# Patient Record
Sex: Male | Born: 1965 | Race: White | Hispanic: No | Marital: Single | Smoking: Never smoker
Health system: Southern US, Community
[De-identification: ages and names within clinical notes are randomized; demographics above are authoritative.]

## PROBLEM LIST (undated history)

## (undated) DIAGNOSIS — E119 Type 2 diabetes mellitus without complications: Secondary | ICD-10-CM

## (undated) DIAGNOSIS — K219 Gastro-esophageal reflux disease without esophagitis: Secondary | ICD-10-CM

## (undated) DIAGNOSIS — Z6831 Body mass index (BMI) 31.0-31.9, adult: Secondary | ICD-10-CM

## (undated) DIAGNOSIS — H6690 Otitis media, unspecified, unspecified ear: Secondary | ICD-10-CM

## (undated) DIAGNOSIS — E7800 Pure hypercholesterolemia, unspecified: Secondary | ICD-10-CM

## (undated) DIAGNOSIS — J189 Pneumonia, unspecified organism: Secondary | ICD-10-CM

## (undated) DIAGNOSIS — G4730 Sleep apnea, unspecified: Secondary | ICD-10-CM

## (undated) HISTORY — DX: Sleep apnea, unspecified: G47.30

## (undated) HISTORY — DX: Body mass index (BMI) 31.0-31.9, adult: Z68.31

## (undated) HISTORY — PX: COLONOSCOPY: SHX174

## (undated) HISTORY — PX: CHOLECYSTECTOMY: SHX55

## (undated) HISTORY — DX: Type 2 diabetes mellitus without complications: E11.9

## (undated) HISTORY — DX: Gastro-esophageal reflux disease without esophagitis: K21.9

## (undated) HISTORY — DX: Pure hypercholesterolemia, unspecified: E78.00

---

## 2013-10-15 ENCOUNTER — Emergency Department (HOSPITAL_COMMUNITY)
Admission: EM | Admit: 2013-10-15 | Discharge: 2013-10-16 | Disposition: A | Discharge status: Home / Self Care | Payer: BC Managed Care – PPO | Attending: Emergency Medicine | Admitting: Emergency Medicine

## 2013-10-15 ENCOUNTER — Emergency Department (HOSPITAL_COMMUNITY): Payer: BC Managed Care – PPO

## 2013-10-15 ENCOUNTER — Encounter (HOSPITAL_COMMUNITY): Payer: Self-pay | Admitting: Emergency Medicine

## 2013-10-15 DIAGNOSIS — H6690 Otitis media, unspecified, unspecified ear: Secondary | ICD-10-CM | POA: Insufficient documentation

## 2013-10-15 DIAGNOSIS — Z8701 Personal history of pneumonia (recurrent): Secondary | ICD-10-CM | POA: Insufficient documentation

## 2013-10-15 DIAGNOSIS — J40 Bronchitis, not specified as acute or chronic: Secondary | ICD-10-CM

## 2013-10-15 DIAGNOSIS — R6889 Other general symptoms and signs: Secondary | ICD-10-CM

## 2013-10-15 DIAGNOSIS — Z880 Allergy status to penicillin: Secondary | ICD-10-CM | POA: Insufficient documentation

## 2013-10-15 DIAGNOSIS — R739 Hyperglycemia, unspecified: Secondary | ICD-10-CM

## 2013-10-15 DIAGNOSIS — R7309 Other abnormal glucose: Secondary | ICD-10-CM | POA: Insufficient documentation

## 2013-10-15 DIAGNOSIS — Z79899 Other long term (current) drug therapy: Secondary | ICD-10-CM | POA: Insufficient documentation

## 2013-10-15 HISTORY — DX: Pneumonia, unspecified organism: J18.9

## 2013-10-15 HISTORY — DX: Otitis media, unspecified, unspecified ear: H66.90

## 2013-10-15 LAB — COMPREHENSIVE METABOLIC PANEL
ALK PHOS: 68 U/L (ref 39–117)
ALT: 61 U/L — ABNORMAL HIGH (ref 0–53)
AST: 25 U/L (ref 0–37)
Albumin: 4.4 g/dL (ref 3.5–5.2)
BILIRUBIN TOTAL: 0.2 mg/dL — AB (ref 0.3–1.2)
BUN: 17 mg/dL (ref 6–23)
CHLORIDE: 98 meq/L (ref 96–112)
CO2: 18 meq/L — AB (ref 19–32)
Calcium: 10.5 mg/dL (ref 8.4–10.5)
Creatinine, Ser: 0.68 mg/dL (ref 0.50–1.35)
GFR calc Af Amer: >90 mL/min (ref >90)
Glucose, Bld: 251 mg/dL — ABNORMAL HIGH (ref 70–99)
Potassium: 4.4 mEq/L (ref 3.7–5.3)
Sodium: 137 mEq/L (ref 137–147)
Total Protein: 8.1 g/dL (ref 6.0–8.3)

## 2013-10-15 LAB — URINE MICROSCOPIC-ADD ON

## 2013-10-15 LAB — URINALYSIS, ROUTINE W REFLEX MICROSCOPIC
Bilirubin Urine: NEGATIVE
Glucose, UA: >1000 mg/dL — AB
HGB URINE DIPSTICK: NEGATIVE
Ketones, ur: 15 mg/dL — AB
Leukocytes, UA: NEGATIVE
Nitrite: NEGATIVE
PROTEIN: NEGATIVE mg/dL
Specific Gravity, Urine: 1.036 — ABNORMAL HIGH (ref 1.005–1.030)
UROBILINOGEN UA: 0.2 mg/dL (ref 0.0–1.0)
pH: 6 (ref 5.0–8.0)

## 2013-10-15 LAB — CBC WITH DIFFERENTIAL/PLATELET
Basophils Absolute: 0 10*3/uL (ref 0.0–0.1)
Basophils Relative: 0 % (ref 0–1)
Eosinophils Absolute: 0 10*3/uL (ref 0.0–0.7)
Eosinophils Relative: 0 % (ref 0–5)
HCT: 43.5 % (ref 39.0–52.0)
Hemoglobin: 15.6 g/dL (ref 13.0–17.0)
Lymphocytes Relative: 15 % (ref 12–46)
Lymphs Abs: 1.7 10*3/uL (ref 0.7–4.0)
MCH: 31.1 pg (ref 26.0–34.0)
MCHC: 35.9 g/dL (ref 30.0–36.0)
MCV: 86.7 fL (ref 78.0–100.0)
Monocytes Absolute: 0.7 10*3/uL (ref 0.1–1.0)
Monocytes Relative: 6 % (ref 3–12)
Neutro Abs: 9.2 10*3/uL — ABNORMAL HIGH (ref 1.7–7.7)
Neutrophils Relative %: 79 % — ABNORMAL HIGH (ref 43–77)
Platelets: 362 10*3/uL (ref 150–400)
RBC: 5.02 MIL/uL (ref 4.22–5.81)
RDW: 13 % (ref 11.5–15.5)
WBC: 11.6 10*3/uL — ABNORMAL HIGH (ref 4.0–10.5)

## 2013-10-15 LAB — I-STAT TROPONIN, ED: Troponin i, poc: 0 ng/mL (ref 0.00–0.08)

## 2013-10-15 LAB — LACTIC ACID, PLASMA: Lactic Acid, Venous: 2.1 mmol/L (ref 0.5–2.2)

## 2013-10-15 MED ORDER — LORAZEPAM 2 MG/ML IJ SOLN
2.0000 mg | Freq: Once | INTRAMUSCULAR | Status: AC
  Administered 2013-10-15: 2 mg via INTRAMUSCULAR
  Filled 2013-10-15: qty 1

## 2013-10-15 MED ORDER — ASPIRIN 81 MG PO CHEW
324.0000 mg | CHEWABLE_TABLET | Freq: Once | ORAL | Status: AC
  Administered 2013-10-15: 324 mg via ORAL
  Filled 2013-10-15: qty 4

## 2013-10-15 MED ORDER — NITROGLYCERIN 0.4 MG SL SUBL
0.4000 mg | SUBLINGUAL_TABLET | SUBLINGUAL | Status: DC | PRN
  Administered 2013-10-15: 0.4 mg via SUBLINGUAL

## 2013-10-15 MED ORDER — LORAZEPAM 2 MG/ML IJ SOLN: 1.0000 mg | Freq: Once | INTRAMUSCULAR | Status: DC

## 2013-10-15 MED ORDER — SODIUM CHLORIDE 0.9 % IV BOLUS (SEPSIS)
1000.0000 mL | Freq: Once | INTRAVENOUS | Status: AC
  Administered 2013-10-15: 1000 mL via INTRAVENOUS

## 2013-10-15 NOTE — ED Provider Notes (Signed)
CSN: [id number]     Arrival date & time 10/15/13  2017 History   First MD Initiated Contact with Patient 10/15/13 2043     Chief Complaint  Patient presents with  . Chest Pain  . Shortness of Breath     (Consider location/radiation/quality/duration/timing/severity/associated sxs/prior Treatment) HPI Comments: Patient is a 48 year old male with no significant past medical history. He presents today with complaints of shaking episodes that have occurred on 2 occasions this week. He stated that he felt weak and became shaky while he was trying to sleep. He denies any fevers or chills but does admit to having respiratory symptoms. He has had chest congestion and cough and was seen by his primary Dr. and he was started on prednisone and Levaquin. He had another episode that occurred this evening where he became shaky and became tight in the chest. He also felt somewhat short of breath. He arrived here hyperventilating. He believes he may have had an anxiety attack.  The history is provided by the patient.    Past Medical History  Diagnosis Date  . Pneumonia   . Ear infection    History reviewed. No pertinent past surgical history. History reviewed. No pertinent family history. History  Substance Use Topics  . Smoking status: Never Smoker   . Smokeless tobacco: Not on file  . Alcohol Use: No    Review of Systems  All other systems reviewed and are negative.      Allergies  Penicillins  Home Medications   Current Outpatient Rx  Name  Route  Sig  Dispense  Refill  . albuterol (PROAIR HFA) 108 (90 BASE) MCG/ACT inhaler   Inhalation   Inhale 2 puffs into the lungs 3 (three) times daily as needed for wheezing or shortness of breath.         . cetirizine (ZYRTEC) 10 MG tablet   Oral   Take 10 mg by mouth daily.         [name] Dextromethorphan-Guaifenesin (MUCINEX DM MAXIMUM STRENGTH) 60-1200 MG TB12   Oral   Take 1 tablet by mouth every 12 (twelve) hours as needed  (congestion/cough).         [name] levofloxacin (LEVAQUIN) 750 MG tablet   Oral   Take 750 mg by mouth daily. 10 day course started 10/13/13         . predniSONE (DELTASONE) 10 MG tablet   Oral   Take 20-60 mg by mouth daily. 12 day tapered course started 10/14/13:  Take 6 tablets (60 mg) daily for 4 days, then take 4 tablets (40 mg) daily for 4 days, then take 2 tablets (20 mg) daily for 4 days          BP 126/79  Pulse 91  Temp(Src) 97.4 F (36.3 C) (Oral)  Resp 27  Ht 5\' 10"  (1.778 m)  Wt 230 lb (104.327 kg)  BMI 33.00 kg/m2  SpO2 93% Physical Exam  Nursing note and vitals reviewed. Constitutional: He is oriented to person, place, and time. He appears well-developed and well-nourished. No distress.  HENT:  Head: Normocephalic and atraumatic.  Mouth/Throat: Oropharynx is clear and moist.  Neck: Normal range of motion. Neck supple.  Cardiovascular: Normal rate, regular rhythm and normal heart sounds.   No murmur heard. Pulmonary/Chest: Effort normal and breath sounds normal. No respiratory distress. He has no wheezes.  Abdominal: Soft. Bowel sounds are normal. He exhibits no distension. There is no tenderness.  Musculoskeletal: Normal range of motion. He exhibits no edema.  Neurological: He is alert and oriented to person, place, and time. No cranial nerve deficit. He exhibits normal muscle tone. Coordination normal.  Skin: Skin is warm and dry. He is not diaphoretic.    ED Course  Procedures (including critical care time) Labs Review Labs Reviewed  CBC WITH DIFFERENTIAL - Abnormal; Notable for the following:    WBC 11.6 (*)    Neutrophils Relative % 79 (*)    Neutro Abs 9.2 (*)    All other components within normal limits  COMPREHENSIVE METABOLIC PANEL - Abnormal; Notable for the following:    CO2 18 (*)    Glucose, Bld 251 (*)    ALT 61 (*)    Total Bilirubin 0.2 (*)    All other components within normal limits  URINALYSIS, ROUTINE W REFLEX MICROSCOPIC  LACTIC  ACID, PLASMA  I-STAT TROPOININ, ED   Imaging Review Ct Head Wo Contrast  10/15/2013   CLINICAL DATA:  Chest pain, shortness of breath, ear infection.  EXAM: CT HEAD WITHOUT CONTRAST  TECHNIQUE: Contiguous axial images were obtained from the base of the skull through the vertex without intravenous contrast.  COMPARISON:  CT of the sinuses October 04, 2003  FINDINGS: The ventricles and sulci are normal. No intraparenchymal hemorrhage, mass effect nor midline shift. No acute large vascular territory infarcts.  No abnormal extra-axial fluid collections. Basal cisterns are patent.  No skull fracture. Left maxillary mucosal retention cyst without paranasal sinus air-fluid levels. The mastoid air cells are well aerated. Though not tailored for evaluation, the middle ears appear well aerated. 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 The included ocular globes and orbital contents are non-suspicious.  IMPRESSION: No acute intracranial process.  Normal noncontrast CT of the head.   Electronically Signed   By: Awilda Metroourtnay  Bloomer   On: 10/15/2013 21:56   Dg Chest Port 1 View  10/15/2013   CLINICAL DATA:  Left-sided chest pain.  Short of breath.  EXAM: PORTABLE CHEST - 1 VIEW  COMPARISON:  None.  FINDINGS: The heart size and mediastinal contours are within normal limits. Both lungs are clear. The visualized skeletal structures are unremarkable.  IMPRESSION: No active disease.   Electronically Signed   By: Amie Portlandavid  Ormond M.D.   On: 10/15/2013 20:56     EKG Interpretation   Date/Time:  Sunday October 15 2013 20:19:39 EDT Ventricular Rate:  115 PR Interval:  134 QRS Duration: 86 QT Interval:  310 QTC Calculation: 428 R Axis:   70 Text Interpretation:  Sinus tachycardia Minimal voltage criteria for LVH,  may be normal variant Nonspecific ST abnormality Abnormal ECG Confirmed by  DELOS  MD, Jaquia Benedicto ([id number]) on 10/15/2013 10:26:36 PM      MDM   Final  diagnoses:  None    Workup reveals a white count of 11.6, blood sugar of 251, and CO2 of 18 with an anion gap of 21, suggestive of mild DKA, however he does not appear ill or toxic.  He was given 1 L of normal saline and repeat glucose is 181.  He appears appropriate for discharge.  He is to monitor his sugars (mother has a glucometer), and follow up with his pcp later this week to have repeat labs.  He is to return if he develops worsening symptoms.  I will also advise him to stop his prednisone.   Geoffery Lyonsouglas Mykala Mccready, MD 10/16/13 [phone]

## 2013-10-15 NOTE — ED Notes (Addendum)
C/o CP and sob, 7/10, L chest radiating to L arm, also sob, shaky, unable to concentrate, shaky, hyperventilating, shallow resps, episode on Tuesday, "went away", seen today at urgent care in [place], given levaquin, proair and prednisone Rx, reports got steroid shot and neb tx there. Has recently been on abx for ear infection. States, 'felt this way before receiving meds".

## 2013-10-15 NOTE — ED Notes (Signed)
pCXR at [facility] Wife at [facility]. Pt slowing his breathing down. Pain changed after ntg. "created HA, CP got worse (pounding developed, then chest pounding went away), now rates L CP 4/10 from 5/10". PBT called to draw labs.

## 2013-10-16 LAB — CBG MONITORING, ED: Glucose-Capillary: 181 mg/dL — ABNORMAL HIGH (ref 70–99)

## 2013-10-16 MED ORDER — INSULIN ASPART 100 UNIT/ML ~~LOC~~ SOLN: 4.0000 [IU] | Freq: Once | SUBCUTANEOUS | Status: DC

## 2013-10-16 NOTE — ED Notes (Signed)
CBG was 181 

## 2013-10-16 NOTE — Discharge Instructions (Signed)
Stop your prednisone.  Monitor your sugars twice daily and keep a record of this. Be sure to followup with your primary Dr. in the next few days to discuss repeat lab work.  Return to the emergency department if you develop any new or bothersome symptoms.   Hyperglycemia Hyperglycemia occurs when the glucose (sugar) in your blood is too high. Hyperglycemia can happen for many reasons, but it most often happens to people who do not know they have diabetes or are not managing their diabetes properly.  CAUSES  Whether you have diabetes or not, there are other causes of hyperglycemia. Hyperglycemia can occur when you have diabetes, but it can also occur in other situations that you might not be as aware of, such as: Diabetes  If you have diabetes and are having problems controlling your blood glucose, hyperglycemia could occur because of some of the following reasons:  Not following your meal plan.  Not taking your diabetes medications or not taking it properly.  Exercising less or doing less activity than you normally do.  Being sick. Pre-diabetes  This cannot be ignored. Before people develop Type 2 diabetes, they almost always have "pre-diabetes." This is when your blood glucose levels are higher than normal, but not yet high enough to be diagnosed as diabetes. Research has shown that some long-term damage to the body, especially the heart and circulatory system, may already be occurring during pre-diabetes. If you take action to manage your blood glucose when you have pre-diabetes, you may delay or prevent Type 2 diabetes from developing. Stress  If you have diabetes, you may be "diet" controlled or on oral medications or insulin to control your diabetes. However, you may find that your blood glucose is higher than usual in the hospital whether you have diabetes or not. This is often referred to as "stress hyperglycemia." Stress can elevate your blood glucose. This happens because of  hormones put out by the body during times of stress. If stress has been the cause of your high blood glucose, it can be followed regularly by your caregiver. That way he/she can make sure your hyperglycemia does not continue to get worse or progress to diabetes. Steroids  Steroids are medications that act on the infection fighting system (immune system) to block inflammation or infection. One side effect can be a rise in blood glucose. Most people can produce enough extra insulin to allow for this rise, but for those who cannot, steroids make blood glucose levels go even higher. It is not unusual for steroid treatments to "uncover" diabetes that is developing. It is not always possible to determine if the hyperglycemia will go away after the steroids are stopped. A special blood test called an A1c is sometimes done to determine if your blood glucose was elevated before the steroids were started. SYMPTOMS  Thirsty.  Frequent urination.  Dry mouth.  Blurred vision.  Tired or fatigue.  Weakness.  Sleepy.  Tingling in feet or leg. DIAGNOSIS  Diagnosis is made by monitoring blood glucose in one or all of the following ways:  A1c test. This is a chemical found in your blood.  Fingerstick blood glucose monitoring.  Laboratory results. TREATMENT  First, knowing the cause of the hyperglycemia is important before the hyperglycemia can be treated. Treatment may include, but is not be limited to:  Education.  Change or adjustment in medications.  Change or adjustment in meal plan.  Treatment for an illness, infection, etc.  More frequent blood glucose monitoring.  Change in exercise plan.  Decreasing or stopping steroids.  Lifestyle changes. HOME CARE INSTRUCTIONS   Test your blood glucose as directed.  Exercise regularly. Your caregiver will give you instructions about exercise. Pre-diabetes or diabetes which comes on with stress is helped by exercising.  Eat wholesome,  balanced meals. Eat often and at regular, fixed times. Your caregiver or nutritionist will give you a meal plan to guide your sugar intake.  Being at an ideal weight is important. If needed, losing as little as 10 to 15 pounds may help improve blood glucose levels. SEEK MEDICAL CARE IF:   You have questions about medicine, activity, or diet.  You continue to have symptoms (problems such as increased thirst, urination, or weight gain). SEEK IMMEDIATE MEDICAL CARE IF:   You are vomiting or have diarrhea.  Your breath smells fruity.  You are breathing faster or slower.  You are very sleepy or incoherent.  You have numbness, tingling, or pain in your feet or hands.  You have chest pain.  Your symptoms get worse even though you have been following your caregiver's orders.  If you have any other questions or concerns. Document Released: 12/30/2000 Document Revised: 09/28/2011 Document Reviewed: 11/02/2011 [facility] Patient Information 2014 [place], [place].

## 2015-12-17 DIAGNOSIS — K219 Gastro-esophageal reflux disease without esophagitis: Secondary | ICD-10-CM | POA: Diagnosis not present

## 2015-12-17 DIAGNOSIS — M158 Other polyosteoarthritis: Secondary | ICD-10-CM | POA: Diagnosis not present

## 2015-12-17 DIAGNOSIS — E1142 Type 2 diabetes mellitus with diabetic polyneuropathy: Secondary | ICD-10-CM | POA: Diagnosis not present

## 2015-12-17 DIAGNOSIS — G5603 Carpal tunnel syndrome, bilateral upper limbs: Secondary | ICD-10-CM | POA: Diagnosis not present

## 2015-12-18 DIAGNOSIS — M158 Other polyosteoarthritis: Secondary | ICD-10-CM | POA: Diagnosis not present

## 2015-12-18 DIAGNOSIS — R202 Paresthesia of skin: Secondary | ICD-10-CM | POA: Diagnosis not present

## 2015-12-18 DIAGNOSIS — E119 Type 2 diabetes mellitus without complications: Secondary | ICD-10-CM | POA: Diagnosis not present

## 2015-12-18 DIAGNOSIS — E782 Mixed hyperlipidemia: Secondary | ICD-10-CM | POA: Diagnosis not present

## 2016-02-02 DIAGNOSIS — R339 Retention of urine, unspecified: Secondary | ICD-10-CM | POA: Diagnosis not present

## 2016-02-02 DIAGNOSIS — N200 Calculus of kidney: Secondary | ICD-10-CM | POA: Diagnosis not present

## 2016-02-02 DIAGNOSIS — M545 Low back pain: Secondary | ICD-10-CM | POA: Diagnosis not present

## 2016-02-02 DIAGNOSIS — R109 Unspecified abdominal pain: Secondary | ICD-10-CM | POA: Diagnosis not present

## 2016-04-03 DIAGNOSIS — K219 Gastro-esophageal reflux disease without esophagitis: Secondary | ICD-10-CM | POA: Diagnosis not present

## 2016-04-03 DIAGNOSIS — G5603 Carpal tunnel syndrome, bilateral upper limbs: Secondary | ICD-10-CM | POA: Diagnosis not present

## 2016-04-03 DIAGNOSIS — E1142 Type 2 diabetes mellitus with diabetic polyneuropathy: Secondary | ICD-10-CM | POA: Diagnosis not present

## 2016-04-03 DIAGNOSIS — Z23 Encounter for immunization: Secondary | ICD-10-CM | POA: Diagnosis not present

## 2016-04-03 DIAGNOSIS — E782 Mixed hyperlipidemia: Secondary | ICD-10-CM | POA: Diagnosis not present

## 2016-09-14 DIAGNOSIS — E782 Mixed hyperlipidemia: Secondary | ICD-10-CM | POA: Diagnosis not present

## 2016-09-14 DIAGNOSIS — F410 Panic disorder [episodic paroxysmal anxiety] without agoraphobia: Secondary | ICD-10-CM

## 2016-09-14 DIAGNOSIS — F411 Generalized anxiety disorder: Secondary | ICD-10-CM | POA: Diagnosis not present

## 2016-09-14 DIAGNOSIS — E1142 Type 2 diabetes mellitus with diabetic polyneuropathy: Secondary | ICD-10-CM | POA: Diagnosis not present

## 2016-09-14 DIAGNOSIS — K219 Gastro-esophageal reflux disease without esophagitis: Secondary | ICD-10-CM | POA: Diagnosis not present

## 2016-09-14 HISTORY — DX: Panic disorder (episodic paroxysmal anxiety): F41.0

## 2017-02-16 DIAGNOSIS — K219 Gastro-esophageal reflux disease without esophagitis: Secondary | ICD-10-CM | POA: Diagnosis not present

## 2017-02-16 DIAGNOSIS — E782 Mixed hyperlipidemia: Secondary | ICD-10-CM | POA: Diagnosis not present

## 2017-02-16 DIAGNOSIS — E1142 Type 2 diabetes mellitus with diabetic polyneuropathy: Secondary | ICD-10-CM | POA: Diagnosis not present

## 2017-04-27 DIAGNOSIS — N200 Calculus of kidney: Secondary | ICD-10-CM | POA: Diagnosis not present

## 2017-04-27 DIAGNOSIS — R1084 Generalized abdominal pain: Secondary | ICD-10-CM | POA: Diagnosis not present

## 2017-04-27 DIAGNOSIS — K353 Acute appendicitis with localized peritonitis, without perforation or gangrene: Secondary | ICD-10-CM | POA: Diagnosis not present

## 2017-04-28 DIAGNOSIS — N451 Epididymitis: Secondary | ICD-10-CM | POA: Diagnosis not present

## 2017-04-28 DIAGNOSIS — N433 Hydrocele, unspecified: Secondary | ICD-10-CM | POA: Diagnosis not present

## 2017-04-28 DIAGNOSIS — N5082 Scrotal pain: Secondary | ICD-10-CM | POA: Diagnosis not present

## 2017-04-28 DIAGNOSIS — R10813 Right lower quadrant abdominal tenderness: Secondary | ICD-10-CM | POA: Diagnosis not present

## 2017-04-28 DIAGNOSIS — I861 Scrotal varices: Secondary | ICD-10-CM | POA: Diagnosis not present

## 2017-05-25 DIAGNOSIS — E782 Mixed hyperlipidemia: Secondary | ICD-10-CM | POA: Diagnosis not present

## 2017-05-25 DIAGNOSIS — Z23 Encounter for immunization: Secondary | ICD-10-CM | POA: Diagnosis not present

## 2017-05-25 DIAGNOSIS — K219 Gastro-esophageal reflux disease without esophagitis: Secondary | ICD-10-CM | POA: Diagnosis not present

## 2017-05-25 DIAGNOSIS — E1142 Type 2 diabetes mellitus with diabetic polyneuropathy: Secondary | ICD-10-CM | POA: Diagnosis not present

## 2017-05-27 DIAGNOSIS — R599 Enlarged lymph nodes, unspecified: Secondary | ICD-10-CM | POA: Diagnosis not present

## 2017-05-27 DIAGNOSIS — K4090 Unilateral inguinal hernia, without obstruction or gangrene, not specified as recurrent: Secondary | ICD-10-CM | POA: Diagnosis not present

## 2017-06-03 DIAGNOSIS — K4090 Unilateral inguinal hernia, without obstruction or gangrene, not specified as recurrent: Secondary | ICD-10-CM | POA: Diagnosis not present

## 2017-06-21 DIAGNOSIS — Z1211 Encounter for screening for malignant neoplasm of colon: Secondary | ICD-10-CM | POA: Diagnosis not present

## 2017-10-14 DIAGNOSIS — W268XXA Contact with other sharp object(s), not elsewhere classified, initial encounter: Secondary | ICD-10-CM | POA: Diagnosis not present

## 2017-10-14 DIAGNOSIS — E119 Type 2 diabetes mellitus without complications: Secondary | ICD-10-CM | POA: Diagnosis not present

## 2017-10-14 DIAGNOSIS — Z23 Encounter for immunization: Secondary | ICD-10-CM | POA: Diagnosis not present

## 2017-10-14 DIAGNOSIS — S51812A Laceration without foreign body of left forearm, initial encounter: Secondary | ICD-10-CM | POA: Diagnosis not present

## 2017-10-14 DIAGNOSIS — Z6830 Body mass index (BMI) 30.0-30.9, adult: Secondary | ICD-10-CM | POA: Diagnosis not present

## 2017-10-14 DIAGNOSIS — S41112A Laceration without foreign body of left upper arm, initial encounter: Secondary | ICD-10-CM | POA: Diagnosis not present

## 2017-10-14 DIAGNOSIS — E7800 Pure hypercholesterolemia, unspecified: Secondary | ICD-10-CM | POA: Diagnosis not present

## 2017-10-14 DIAGNOSIS — Z87891 Personal history of nicotine dependence: Secondary | ICD-10-CM | POA: Diagnosis not present

## 2017-10-22 DIAGNOSIS — E1169 Type 2 diabetes mellitus with other specified complication: Secondary | ICD-10-CM | POA: Diagnosis not present

## 2017-10-22 DIAGNOSIS — E782 Mixed hyperlipidemia: Secondary | ICD-10-CM | POA: Diagnosis not present

## 2017-10-22 DIAGNOSIS — E1142 Type 2 diabetes mellitus with diabetic polyneuropathy: Secondary | ICD-10-CM | POA: Diagnosis not present

## 2017-10-22 DIAGNOSIS — K4090 Unilateral inguinal hernia, without obstruction or gangrene, not specified as recurrent: Secondary | ICD-10-CM | POA: Diagnosis not present

## 2017-10-22 DIAGNOSIS — G4733 Obstructive sleep apnea (adult) (pediatric): Secondary | ICD-10-CM | POA: Diagnosis not present

## 2017-12-31 DIAGNOSIS — H524 Presbyopia: Secondary | ICD-10-CM | POA: Diagnosis not present

## 2017-12-31 DIAGNOSIS — H5203 Hypermetropia, bilateral: Secondary | ICD-10-CM | POA: Diagnosis not present

## 2017-12-31 DIAGNOSIS — G245 Blepharospasm: Secondary | ICD-10-CM | POA: Diagnosis not present

## 2017-12-31 DIAGNOSIS — E119 Type 2 diabetes mellitus without complications: Secondary | ICD-10-CM | POA: Diagnosis not present

## 2017-12-31 DIAGNOSIS — H52223 Regular astigmatism, bilateral: Secondary | ICD-10-CM | POA: Diagnosis not present

## 2018-01-25 DIAGNOSIS — E668 Other obesity: Secondary | ICD-10-CM | POA: Diagnosis not present

## 2018-01-25 DIAGNOSIS — E1142 Type 2 diabetes mellitus with diabetic polyneuropathy: Secondary | ICD-10-CM | POA: Diagnosis not present

## 2018-01-25 DIAGNOSIS — E782 Mixed hyperlipidemia: Secondary | ICD-10-CM | POA: Diagnosis not present

## 2018-01-25 DIAGNOSIS — Z6830 Body mass index (BMI) 30.0-30.9, adult: Secondary | ICD-10-CM | POA: Diagnosis not present

## 2018-05-17 DIAGNOSIS — E1142 Type 2 diabetes mellitus with diabetic polyneuropathy: Secondary | ICD-10-CM | POA: Diagnosis not present

## 2018-05-17 DIAGNOSIS — E782 Mixed hyperlipidemia: Secondary | ICD-10-CM | POA: Diagnosis not present

## 2018-05-17 DIAGNOSIS — R202 Paresthesia of skin: Secondary | ICD-10-CM | POA: Diagnosis not present

## 2018-05-17 DIAGNOSIS — R5383 Other fatigue: Secondary | ICD-10-CM | POA: Diagnosis not present

## 2018-05-17 DIAGNOSIS — Z23 Encounter for immunization: Secondary | ICD-10-CM | POA: Diagnosis not present

## 2018-05-17 DIAGNOSIS — E668 Other obesity: Secondary | ICD-10-CM | POA: Diagnosis not present

## 2018-05-17 DIAGNOSIS — M7061 Trochanteric bursitis, right hip: Secondary | ICD-10-CM

## 2018-05-17 HISTORY — DX: Trochanteric bursitis, right hip: M70.61

## 2018-06-28 DIAGNOSIS — F410 Panic disorder [episodic paroxysmal anxiety] without agoraphobia: Secondary | ICD-10-CM | POA: Diagnosis not present

## 2018-06-28 DIAGNOSIS — G4733 Obstructive sleep apnea (adult) (pediatric): Secondary | ICD-10-CM | POA: Diagnosis not present

## 2018-06-28 DIAGNOSIS — R002 Palpitations: Secondary | ICD-10-CM | POA: Diagnosis not present

## 2018-06-28 DIAGNOSIS — F411 Generalized anxiety disorder: Secondary | ICD-10-CM

## 2018-06-28 DIAGNOSIS — K219 Gastro-esophageal reflux disease without esophagitis: Secondary | ICD-10-CM | POA: Diagnosis not present

## 2018-06-28 HISTORY — DX: Generalized anxiety disorder: F41.1

## 2018-07-01 ENCOUNTER — Other Ambulatory Visit: Payer: Self-pay | Admitting: *Deleted

## 2018-07-01 DIAGNOSIS — R002 Palpitations: Secondary | ICD-10-CM

## 2018-07-11 ENCOUNTER — Ambulatory Visit (INDEPENDENT_AMBULATORY_CARE_PROVIDER_SITE_OTHER): Payer: BLUE CROSS/BLUE SHIELD

## 2018-07-11 DIAGNOSIS — R002 Palpitations: Secondary | ICD-10-CM

## 2018-07-18 ENCOUNTER — Ambulatory Visit: Payer: BLUE CROSS/BLUE SHIELD | Admitting: Cardiology

## 2018-07-27 DIAGNOSIS — G4733 Obstructive sleep apnea (adult) (pediatric): Secondary | ICD-10-CM | POA: Diagnosis not present

## 2018-10-21 DIAGNOSIS — G4733 Obstructive sleep apnea (adult) (pediatric): Secondary | ICD-10-CM | POA: Diagnosis not present

## 2018-10-21 DIAGNOSIS — F410 Panic disorder [episodic paroxysmal anxiety] without agoraphobia: Secondary | ICD-10-CM | POA: Diagnosis not present

## 2018-10-21 DIAGNOSIS — K219 Gastro-esophageal reflux disease without esophagitis: Secondary | ICD-10-CM | POA: Diagnosis not present

## 2018-10-21 DIAGNOSIS — E782 Mixed hyperlipidemia: Secondary | ICD-10-CM | POA: Diagnosis not present

## 2018-10-21 DIAGNOSIS — E1142 Type 2 diabetes mellitus with diabetic polyneuropathy: Secondary | ICD-10-CM | POA: Diagnosis not present

## 2018-12-15 DIAGNOSIS — R599 Enlarged lymph nodes, unspecified: Secondary | ICD-10-CM | POA: Diagnosis not present

## 2018-12-15 DIAGNOSIS — K4090 Unilateral inguinal hernia, without obstruction or gangrene, not specified as recurrent: Secondary | ICD-10-CM | POA: Diagnosis not present

## 2018-12-23 DIAGNOSIS — R1031 Right lower quadrant pain: Secondary | ICD-10-CM | POA: Diagnosis not present

## 2018-12-23 DIAGNOSIS — N401 Enlarged prostate with lower urinary tract symptoms: Secondary | ICD-10-CM | POA: Diagnosis not present

## 2019-01-02 DIAGNOSIS — E1169 Type 2 diabetes mellitus with other specified complication: Secondary | ICD-10-CM | POA: Diagnosis not present

## 2019-01-02 DIAGNOSIS — E782 Mixed hyperlipidemia: Secondary | ICD-10-CM | POA: Diagnosis not present

## 2019-01-23 DIAGNOSIS — R1031 Right lower quadrant pain: Secondary | ICD-10-CM | POA: Diagnosis not present

## 2019-01-23 DIAGNOSIS — M5416 Radiculopathy, lumbar region: Secondary | ICD-10-CM | POA: Diagnosis not present

## 2019-01-23 DIAGNOSIS — M5136 Other intervertebral disc degeneration, lumbar region: Secondary | ICD-10-CM | POA: Diagnosis not present

## 2019-01-23 DIAGNOSIS — K4090 Unilateral inguinal hernia, without obstruction or gangrene, not specified as recurrent: Secondary | ICD-10-CM | POA: Diagnosis not present

## 2019-01-23 DIAGNOSIS — R109 Unspecified abdominal pain: Secondary | ICD-10-CM | POA: Diagnosis not present

## 2019-03-02 ENCOUNTER — Ambulatory Visit (INDEPENDENT_AMBULATORY_CARE_PROVIDER_SITE_OTHER): Payer: BC Managed Care – PPO | Admitting: Cardiology

## 2019-03-02 ENCOUNTER — Other Ambulatory Visit: Payer: Self-pay

## 2019-03-02 ENCOUNTER — Encounter: Payer: Self-pay | Admitting: Cardiology

## 2019-03-02 VITALS — BP 136/80 | HR 67 | Ht 70.0 in | Wt 211.1 lb

## 2019-03-02 DIAGNOSIS — K219 Gastro-esophageal reflux disease without esophagitis: Secondary | ICD-10-CM | POA: Insufficient documentation

## 2019-03-02 DIAGNOSIS — E119 Type 2 diabetes mellitus without complications: Secondary | ICD-10-CM | POA: Diagnosis not present

## 2019-03-02 DIAGNOSIS — R079 Chest pain, unspecified: Secondary | ICD-10-CM

## 2019-03-02 DIAGNOSIS — R002 Palpitations: Secondary | ICD-10-CM | POA: Diagnosis not present

## 2019-03-02 DIAGNOSIS — G4730 Sleep apnea, unspecified: Secondary | ICD-10-CM | POA: Insufficient documentation

## 2019-03-02 DIAGNOSIS — E7800 Pure hypercholesterolemia, unspecified: Secondary | ICD-10-CM | POA: Diagnosis not present

## 2019-03-02 DIAGNOSIS — Z01812 Encounter for preprocedural laboratory examination: Secondary | ICD-10-CM

## 2019-03-02 MED ORDER — METOPROLOL TARTRATE 50 MG PO TABS: 100.0000 mg | ORAL_TABLET | Freq: Once | ORAL | 0 refills | Status: DC

## 2019-03-02 MED ORDER — CELECOXIB 200 MG PO CAPS: 200.0000 mg | ORAL_CAPSULE | Freq: Two times a day (BID) | ORAL | 0 refills | Status: AC

## 2019-03-02 NOTE — Progress Notes (Signed)
Cardiology Office Note:    Date:  03/02/2019   ID:  Brian Pittman, DOB 06/22/1966, MRN [id number]  PCP:  Brian Pittman, [name], MD  Cardiologist:  Brian HerrlichBrian , MD   Referring MD: No ref. provider found  ASSESSMENT:    1. Chest pain in adult   2. Hypercholesterolemia   3. Type 2 diabetes mellitus without complication, without long-term current use of insulin (HCC)   4. Palpitations   5. Pre-procedure lab exam   6. Chest pain, unspecified type    PLAN:    In order of problems listed above:  1. His chest pain is atypical he has chest wall tenderness seems to be most consistent with costochondritis I placed him on Celebrex twice daily for 2 weeks and after discussion of further evaluation will undergo cardiac CTA as an outpatient and follow-up with me in 6 weeks. 2. Palpitation improved continue beta-blocker 3. Hyperlipidemia stable continue statin 4. Type 2 diabetes stable managed by his PCP  Next appointment 6 weeks after CTA   Medication Adjustments/Labs and Tests Ordered: Current medicines are reviewed at length with the patient today.  Concerns regarding medicines are outlined above.  Orders Placed This Encounter  Procedures  . CT CORONARY FRACTIONAL FLOW RESERVE DATA PREP  . CT CORONARY FRACTIONAL FLOW RESERVE FLUID ANALYSIS  . CT CORONARY MORPH W/CTA COR W/SCORE W/CA W/CM &/OR WO/CM  . Basic Metabolic Panel (BMET)  . EKG 12-Lead   Meds ordered this encounter  Medications  . metoprolol tartrate (LOPRESSOR) 50 MG tablet    Sig: Take 2 tablets (100 mg total) by mouth once for 1 dose. Take 2 hours before your cardiac CTA.    Dispense:  2 tablet    Refill:  0  . celecoxib (CELEBREX) 200 MG capsule    Sig: Take 1 capsule (200 mg total) by mouth 2 (two) times daily for 14 days.    Dispense:  30 capsule    Refill:  0     No chief complaint on file.   History of Present Illness:    Brian Pittman is a 53 y.o. male who is being seen today for the evaluation of "mild chest pain  and flutters", he is self-referred.   PMH DM2, GERD, HLD, sleep apnea, right inguinal hernia. He [facility] right inguinal hernia and anticipates surgery. He presently uses smokeless tobacco. He has a family history notable for diabetes, cancer, and stroke.   We recognize each other I had seen him approximately [facility] he had a stress test modality done that we felt with an adequate and he has an outpatient underwent [facility] which was normal.  He tells me in the range of 6 months ago he wore a 3-day ZIO monitor that was normal.  Recently for the last weeks he has been having nonexertional chest pain he describes as Pittman localized left sternal border tender to touch more sharp and heavy and radiates into the back.  He is concerned about the potential for heart disease and is also noted that his strength and endurance is not what it was and he wonders whether this age or working at hot humid weather.  His chest pain is nonexertional no shortness of breath no palpitation or syncope.  He has had no recent chest wall trauma. Past Medical History:  Diagnosis Date  . DM2 (diabetes mellitus, type  2) (HCC)   . Ear infection   . GERD (gastroesophageal reflux disease)   . Hypercholesterolemia   . Pneumonia   . Sleep apnea     Past Surgical History:  Procedure Laterality Date  . CHOLECYSTECTOMY      Current Medications: Current Meds  Medication Sig  . glimepiride (AMARYL) 2 MG tablet Take 1 tablet by mouth 2 (two) times daily.  . meloxicam (MOBIC) 15 MG tablet Take 15 mg by mouth daily as needed.  [name]. omeprazole (PRILOSEC) 20 MG capsule Take 20 mg by mouth daily.  [name]. QTERN 10-5 MG TABS Take 1 tablet by mouth daily.  . rosuvastatin (CRESTOR) 10 MG tablet Take 10 mg by mouth daily.     Allergies:   Metformin, Penicillins, and Prednisone   Social History    Socioeconomic History  . Marital status: Single    Spouse name: Not on file  . Number of children: Not on file  . Years of education: Not on file  . Highest education level: Not on file  Occupational History  . Not on file  Social Needs  . Financial resource strain: Not on file  . Food insecurity    Worry: Not on file    Inability: Not on file  . Transportation needs    Medical: Not on file    Non-medical: Not on file  Tobacco Use  . Smoking status: Never Smoker  . Smokeless tobacco: Former NeurosurgeonUser    Types: Snuff  Substance and Sexual Activity  . Alcohol use: Not Currently  . Drug use: No  . Sexual activity: Not on file  Lifestyle  . Physical activity    Days per week: Not on file    Minutes per session: Not on file  . Stress: Not on file  Relationships  . Social Musicianconnections    Talks on phone: Not on file    Gets together: Not on file    Attends religious service: Not on file    Active member of club or organization: Not on file    Attends meetings of clubs or organizations: Not on file    Relationship status: Not on file  Other Topics Concern  . Not on file  Social History Narrative  . Not on file     Family History: The patient's family history includes Arthritis in his sister; Diabetes in his father and mother; GER disease in his sister; Heart attack in his father and paternal grandmother; Leukemia in his father; Lung cancer in his maternal grandfather; Skin cancer in his father; Stroke in his paternal grandfather.  ROS:   Review of Systems  Constitution: Negative.  HENT: Negative.   Eyes: Negative.   Cardiovascular: Positive for chest pain and palpitations.  Respiratory: Negative.   Endocrine: Negative.   Hematologic/Lymphatic: Negative.   Skin: Negative.   Musculoskeletal: Negative.   Gastrointestinal: Negative.   Genitourinary: Negative.   Neurological: Negative.   Psychiatric/Behavioral: Negative.   Allergic/Immunologic: Negative.    Please see the  history of present illness.     All other systems reviewed and are negative.  EKGs/Labs/Other Studies Reviewed:    The following studies were reviewed today:   EKG:  EKG is  ordered today.  The ekg ordered today is personally reviewed and demonstrates sinus rhythm and is normal  Recent Labs: Pending requested from his PCP  Physical Exam:    VS:  BP 136/80 (BP Location: Left Arm, Patient Position: Sitting, Cuff Size: Large)   Pulse 67  Ht 5\' 10"  (1.778 m)   Wt 211 lb 1.9 oz (95.8 kg)   SpO2 97%   BMI 30.29 kg/m     Wt Readings from Last 3 Encounters:  03/02/19 211 lb 1.9 oz (95.8 kg)  10/15/13 230 lb (104.3 kg)     GEN:  Well nourished, well developed in no acute distress HEENT: Normal NECK: No JVD; No carotid bruits LYMPHATICS: No lymphadenopathy CARDIAC: RRR, no murmurs, rubs, gallops RESPIRATORY:  Clear to auscultation without rales, wheezing or rhonchi  ABDOMEN: Soft, non-tender, non-distended MUSCULOSKELETAL:  No edema; No deformity  SKIN: Warm and dry NEUROLOGIC:  Alert and oriented x 3 PSYCHIATRIC:  Normal affect     Signed, Shirlee More, MD  03/02/2019 3:14 PM    [place]

## 2019-03-02 NOTE — Patient Instructions (Addendum)
Medication Instructions:  Your physician has recommended you make the following change in your medication:   START celecoxib (celebrex) 200 mg: Take 1 capsule twice daily  If you need a refill on your cardiac medications before your next appointment, please call your pharmacy.   Lab work: Your physician recommends that you return for lab work within 3-7 days before your cardiac CTA: BMP. Please return to our office for labs, no appointment needed. No need to fast beforehand.   If you have labs (blood work) drawn today and your tests are completely normal, you will receive your results only by: [name] MyChart Message (if you have MyChart) OR . A paper copy in the mail If you have any lab test that is abnormal or we need to change your treatment, we will call you to review the results.  Testing/Procedures: You had an EKG today.   Your physician has requested that you have cardiac CT. Cardiac computed tomography (CT) is a painless test that uses an x-ray machine to take clear, detailed pictures of your heart. For further information please visit [facility]uge[facility]iesta[employer]tn. Please follow instruction sheet as given.  Your cardiac CT will be scheduled at one of the below locations:   [facility] [address] [place], [place] [address] [phone]  Please arrive at the [facility] main entrance of [facility] 30-45 minutes prior to test start time. Proceed to the [facility] Radiology Department (first floor) to check-in and test prep.  Please follow these instructions carefully (unless otherwise directed):  Hold all erectile dysfunction medications at least 48 hours prior to test.  On the Night Before the Test: . Be sure to Drink plenty of water. . Do not consume any caffeinated/decaffeinated beverages or chocolate 12 hours prior to your test. . Do not take any antihistamines 12 hours prior to your test.  On the Day of the Test: . Drink plenty of water. Do not drink any  water within one hour of the test. . Do not eat any food 4 hours prior to the test. . You may take your regular medications prior to the test.  . HOLD your glimepiride and QTERN the day of testing until after testing is completed.  . Take metoprolol (Lopressor) two hours prior to test. . FEMALES- please wear underwire-free bra if available                 -If HR is less than 55 BPM- No Beta Blocker                -IF HR is greater than 55 BPM and patient is less than or equal to 74 yrs old Lopressor 100mg  x1.      After the Test: . Drink plenty of water. . After receiving IV contrast, you may experience a mild flushed feeling. This is normal. . On occasion, you may experience a mild rash up to 24 hours after the test. This is not dangerous. If this occurs, you can take Benadryl 25 mg and increase your fluid intake. . If you experience trouble breathing, this can be serious. If it is severe call 911 IMMEDIATELY. If it is mild, please call our office.    Please contact the cardiac imaging nurse navigator should you have any questions/concerns Marchia Bond, RN Navigator Cardiac Imaging [facility] [phone] Office  [phone] Cell   Follow-Up: At [facility], you and your health needs are our priority.  As part of our  continuing mission to provide you with exceptional heart care, we have created designated Provider Care Teams.  These Care Teams include your primary Cardiologist (physician) and Advanced Practice Providers (APPs -  Physician Assistants and Nurse Practitioners) who all work together to provide you with the care you need, when you need it. You will need a follow up appointment in 6 weeks.

## 2019-04-17 ENCOUNTER — Ambulatory Visit: Payer: BC Managed Care – PPO | Admitting: Cardiology

## 2019-04-17 DIAGNOSIS — R079 Chest pain, unspecified: Secondary | ICD-10-CM | POA: Diagnosis not present

## 2019-04-17 DIAGNOSIS — Z01812 Encounter for preprocedural laboratory examination: Secondary | ICD-10-CM | POA: Diagnosis not present

## 2019-04-18 LAB — BASIC METABOLIC PANEL
BUN/Creatinine Ratio: 18 (ref 9–20)
BUN: 16 mg/dL (ref 6–24)
CO2: 23 mmol/L (ref 20–29)
Calcium: 9.8 mg/dL (ref 8.7–10.2)
Chloride: 99 mmol/L (ref 96–106)
Creatinine, Ser: 0.88 mg/dL (ref 0.76–1.27)
GFR calc Af Amer: 114 mL/min/{1.73_m2} (ref >59)
GFR calc non Af Amer: 99 mL/min/{1.73_m2} (ref >59)
Glucose: 157 mg/dL — ABNORMAL HIGH (ref 65–99)
Potassium: 4.7 mmol/L (ref 3.5–5.2)
Sodium: 137 mmol/L (ref 134–144)

## 2019-04-19 ENCOUNTER — Encounter (HOSPITAL_COMMUNITY): Payer: Self-pay | Admitting: Emergency Medicine

## 2019-04-20 ENCOUNTER — Telehealth (HOSPITAL_COMMUNITY): Payer: Self-pay | Admitting: Emergency Medicine

## 2019-04-20 NOTE — Telephone Encounter (Signed)
Reaching out to patient to offer assistance regarding upcoming cardiac imaging study; pt verbalizes understanding of appt date/time, parking situation and where to check in, pre-test NPO status and medications ordered, and verified current allergies; name and call back number provided for further questions should they arise Doneta Bayman RN Navigator Cardiac Imaging [facility] [phone] office [phone] cell 

## 2019-04-21 ENCOUNTER — Other Ambulatory Visit: Payer: Self-pay

## 2019-04-21 ENCOUNTER — Ambulatory Visit (HOSPITAL_COMMUNITY)
Admission: RE | Admit: 2019-04-21 | Discharge: 2019-04-21 | Disposition: A | Discharge status: Home / Self Care | Payer: BC Managed Care – PPO | Source: Ambulatory Visit | Attending: Cardiology | Admitting: Cardiology

## 2019-04-21 ENCOUNTER — Encounter: Payer: BC Managed Care – PPO | Admitting: *Deleted

## 2019-04-21 DIAGNOSIS — Z006 Encounter for examination for normal comparison and control in clinical research program: Secondary | ICD-10-CM

## 2019-04-21 DIAGNOSIS — R079 Chest pain, unspecified: Secondary | ICD-10-CM | POA: Insufficient documentation

## 2019-04-21 MED ORDER — NITROGLYCERIN 0.4 MG SL SUBL
SUBLINGUAL_TABLET | SUBLINGUAL | Status: AC
  Filled 2019-04-21: qty 2

## 2019-04-21 MED ORDER — IOHEXOL 350 MG/ML SOLN
80.0000 mL | Freq: Once | INTRAVENOUS | Status: AC | PRN
  Administered 2019-04-21: 80 mL via INTRAVENOUS

## 2019-04-21 MED ORDER — NITROGLYCERIN 0.4 MG SL SUBL
0.8000 mg | SUBLINGUAL_TABLET | Freq: Once | SUBLINGUAL | Status: AC
  Administered 2019-04-21: 0.8 mg via SUBLINGUAL
  Filled 2019-04-21: qty 25

## 2019-04-21 NOTE — Progress Notes (Signed)
Ct complete. Patient denies any complaints. Offered snack and beverage.  

## 2019-04-21 NOTE — Research (Signed)
CADFEM Informed Consent                  Subject Name:   Brian Pittman   Subject met inclusion and exclusion criteria.  The informed consent form, study requirements and expectations were reviewed with the subject and questions and concerns were addressed prior to the signing of the consent form.  The subject verbalized understanding of the trial requirements.  The subject agreed to participate in the CADFEM trial and signed the informed consent.  The informed consent was obtained prior to performance of any protocol-specific procedures for the subject.  A copy of the signed informed consent was given to the subject and a copy was placed in the subject's medical record.   [place]und[place] Chalmers, Research Assistant  04/21/2019 07:27 a.m.

## 2019-04-25 ENCOUNTER — Telehealth: Payer: Self-pay | Admitting: Cardiology

## 2019-04-25 NOTE — Telephone Encounter (Signed)
Patient would like a call back with test results.

## 2019-04-26 NOTE — Telephone Encounter (Signed)
Cardiac CTA is normal

## 2019-04-26 NOTE — Telephone Encounter (Signed)
Patient informed of results per Dr Bettina Gavia.

## 2019-05-08 ENCOUNTER — Ambulatory Visit: Payer: BC Managed Care – PPO | Admitting: Cardiology

## 2019-05-18 DIAGNOSIS — M25542 Pain in joints of left hand: Secondary | ICD-10-CM | POA: Diagnosis not present

## 2019-05-18 DIAGNOSIS — M25541 Pain in joints of right hand: Secondary | ICD-10-CM | POA: Diagnosis not present

## 2019-05-18 DIAGNOSIS — I10 Essential (primary) hypertension: Secondary | ICD-10-CM | POA: Diagnosis not present

## 2019-05-18 DIAGNOSIS — E782 Mixed hyperlipidemia: Secondary | ICD-10-CM | POA: Diagnosis not present

## 2019-05-18 DIAGNOSIS — E1169 Type 2 diabetes mellitus with other specified complication: Secondary | ICD-10-CM | POA: Diagnosis not present

## 2019-05-18 DIAGNOSIS — F410 Panic disorder [episodic paroxysmal anxiety] without agoraphobia: Secondary | ICD-10-CM | POA: Diagnosis not present

## 2019-05-18 DIAGNOSIS — K219 Gastro-esophageal reflux disease without esophagitis: Secondary | ICD-10-CM | POA: Diagnosis not present

## 2019-05-18 DIAGNOSIS — G4733 Obstructive sleep apnea (adult) (pediatric): Secondary | ICD-10-CM | POA: Diagnosis not present

## 2019-05-18 DIAGNOSIS — Z23 Encounter for immunization: Secondary | ICD-10-CM | POA: Diagnosis not present

## 2019-05-31 ENCOUNTER — Ambulatory Visit: Payer: BC Managed Care – PPO | Admitting: Cardiology

## 2019-06-12 DIAGNOSIS — E1142 Type 2 diabetes mellitus with diabetic polyneuropathy: Secondary | ICD-10-CM | POA: Diagnosis not present

## 2019-07-06 ENCOUNTER — Ambulatory Visit: Payer: BC Managed Care – PPO | Admitting: Cardiology

## 2019-08-03 NOTE — Progress Notes (Signed)
Virtual Visit via Video Note   This visit type was conducted due to national recommendations for restrictions regarding the COVID-19 Pandemic (e.g. social distancing) in an effort to limit this patient's exposure and mitigate transmission in our community.  Due to his co-morbid illnesses, this patient is at least at moderate risk for complications without adequate follow up.  This format is felt to be most appropriate for this patient at this time.  All issues noted in this document were discussed and addressed.  A limited physical exam was performed with this format.  Please refer to the patient's chart for his consent to telehealth for [facility].   Date:  08/04/2019   ID:  Brian Pittman, DOB 1966-02-09, MRN [id number]  Patient Location: Home Provider Location: Office  PCP:  Rochel Brome, MD  Cardiologist: Dr. Bettina Gavia Electrophysiologist:  None   Evaluation Performed:  Follow-Up Visit  Chief Complaint: Follow-up after cardiac CTA  History of Present Illness:    Brian Pittman is a 54 y.o. male with a hx of DM2, GERD, HLD, sleep apnea, right inguinal hernia and palpitation  last seen 03/02/2019 for chest pain. Compliance with diet, lifestyle and medications: Yes  He continues to try to control his diabetes last A1c around in the range of 8%.  He continues on a statin recent lipids done his PCP office I will request a copy.  Had a cholecystectomy and since then is not having chest pain shortness of breath palpitations syncope or edema.  I reviewed his cardiac CTA findings with him he has no coronary atherosclerosis and no CAD.  One interpretation is he does not need lipid-lowering therapy but unfortunately he remains at a high risk group with his diabetes and hyperlipidemia and after discussion of benefits and options were remain on lipid-lowering treatment.  And to see him back in my office as needed basis.  Cardiac CTA 04/25/2019: FINDINGS: Coronary calcium score: The patient's  coronary artery calcium score is 0, which places the patient in the 0 percentile. Coronary arteries: Normal coronary origins.  Right dominance. Right Coronary Artery: Gives rise to PDA and PLB. No plaque or stenosis throughout RCA system. Left Main Coronary Artery: No plaque or stenosis Left Anterior Descending Coronary Artery: Gives rise to medium D1 and small D2. No plaque or stenosis throughout LAD system. Left Circumflex Artery: Gives rise to large OM1 and medium OM2. No plaque or stenosis throughout LCx system. Aorta: Normal size, 34 mm at the mid ascending aorta (level of the PA bifurcation) measured double oblique. No calcifications. No dissection. Aortic Valve: No calcifications. Trileaflet. Other findings: Normal pulmonary vein drainage into the left atrium. Normal left atrial appendage without a thrombus. Normal size of the pulmonary artery.  IMPRESSION: 1. No evidence of CAD, CADRADS = 0. 2. Coronary calcium score of 0. This was 0 percentile for age and sex matched control.   The patient does not have symptoms concerning for COVID-19 infection (fever, chills, cough, or new shortness of breath).    Past Medical History:  Diagnosis Date  . DM2 (diabetes mellitus, type 2) ([place])   . Ear infection   . GERD (gastroesophageal reflux disease)   . Hypercholesterolemia   . Pneumonia   . Sleep apnea    Past Surgical History:  Procedure Laterality Date  . CHOLECYSTECTOMY       No outpatient medications have been marked as taking for the 08/04/19 encounter (Telemedicine) with Richardo Priest, MD.     Allergies:   Metformin, Penicillins,  and Prednisone   Social History   Tobacco Use  . Smoking status: Never Smoker  . Smokeless tobacco: Former Neurosurgeon    Types: Snuff  Substance Use Topics  . Alcohol use: Not Currently  . Drug use: No     Family Hx: The patient's family history includes Arthritis in his sister; Diabetes in his father and mother; GER disease in his  sister; Heart attack in his father and paternal grandmother; Leukemia in his father; Lung cancer in his maternal grandfather; Skin cancer in his father; Stroke in his paternal grandfather.  ROS:   Please see the history of present illness.     All other systems reviewed and are negative.   Prior CV studies:   The following studies were reviewed today:    Labs/Other Tests and Data Reviewed:    EKG:  An ECG dated 03/02/2019 was personally reviewed today and demonstrated:  Sinus rhythm normal  Recent Labs: 04/17/2019: BUN 16; Creatinine, Ser 0.88; Potassium 4.7; Sodium 137   Recent Lipid Panel No results found for: CHOL, TRIG, HDL, CHOLHDL, LDLCALC, LDLDIRECT  Wt Readings from Last 3 Encounters:  03/02/19 211 lb 1.9 oz (95.8 kg)  10/15/13 230 lb (104.3 kg)     Objective:    Vital Signs:  Ht 5\' 10"  (1.778 m)   BMI 30.29 kg/m    VITAL SIGNS:  reviewed GEN:  no acute distress EYES:  sclerae anicteric, EOMI - Extraocular Movements Intact RESPIRATORY:  normal respiratory effort, symmetric expansion CARDIOVASCULAR:  no peripheral edema SKIN:  no rash, lesions or ulcers. MUSCULOSKELETAL:  no obvious deformities. NEURO:  alert and oriented x 3, no obvious focal deficit PSYCH:  normal affect  ASSESSMENT & PLAN:    1. Chest pain, surprisingly is of coronary calcium score of 0 no evidence of atherosclerosis and is improved after cholecystectomy.  One interpretation is he does not require lipid-lowering therapy, unfortunately remains high risk with male sex diabetes not at target hyperlipidemia I advised him to stay on his high intensity statin.  Intermediate range his risk of cardiovascular events is quite low he is reassured in the absence of recurrent symptoms I will plan to see him back in the office as needed. 2. Type 2 diabetes last A1c not at target he will continue to work with his primary care physician and I shared with him personal experience of diet is a diabetic 3.  Hyperlipidemia stable continue his high intensity statin  COVID-19 Education: The signs and symptoms of COVID-19 were discussed with the patient and how to seek care for testing (follow up with PCP or arrange E-visit).  The importance of social distancing was discussed today.  Time:   Today, I have spent 15 minutes with the patient with telehealth technology discussing the above problems.     Medication Adjustments/Labs and Tests Ordered: Current medicines are reviewed at length with the patient today.  Concerns regarding medicines are outlined above.   Tests Ordered: No orders of the defined types were placed in this encounter.   Medication Changes: No orders of the defined types were placed in this encounter.   Follow Up:  As needed prn  Signed, , MD  08/04/2019 8:02 AM     [facility]

## 2019-08-04 ENCOUNTER — Other Ambulatory Visit: Payer: Self-pay

## 2019-08-04 ENCOUNTER — Encounter: Payer: Self-pay | Admitting: Cardiology

## 2019-08-04 ENCOUNTER — Telehealth (INDEPENDENT_AMBULATORY_CARE_PROVIDER_SITE_OTHER): Payer: BC Managed Care – PPO | Admitting: Cardiology

## 2019-08-04 VITALS — BP 113/73 | HR 60 | Ht 70.0 in | Wt 212.0 lb

## 2019-08-04 DIAGNOSIS — R079 Chest pain, unspecified: Secondary | ICD-10-CM

## 2019-08-04 DIAGNOSIS — E7800 Pure hypercholesterolemia, unspecified: Secondary | ICD-10-CM | POA: Diagnosis not present

## 2019-08-04 DIAGNOSIS — E119 Type 2 diabetes mellitus without complications: Secondary | ICD-10-CM | POA: Diagnosis not present

## 2019-08-04 NOTE — Patient Instructions (Signed)
Medication Instructions:   *If you need a refill on your cardiac medications before your next appointment, please call your pharmacy*  Lab Work:  If you have labs (blood work) drawn today and your tests are completely normal, you will receive your results only by: [name] MyChart Message (if you have MyChart) OR . A paper copy in the mail If you have any lab test that is abnormal or we need to change your treatment, we will call you to review the results.  Testing/Procedures:   Follow-Up: At [facility], you and your health needs are our priority.  As part of our continuing mission to provide you with exceptional heart care, we have created designated Provider Care Teams.  These Care Teams include your primary Cardiologist (physician) and Advanced Practice Providers (APPs -  Physician Assistants and Nurse Practitioners) who all work together to provide you with the care you need, when you need it.  Your next appointment:    as needed  The format for your next appointment:   Either In Person or Virtual  Provider:   Norman Herrlich, MD  Other Instructions

## 2019-08-20 NOTE — Progress Notes (Deleted)
Established Patient Office Visit  Subjective:  Patient ID: Brian Pittman, male    DOB: [birth year]  Age: 54 y.o. MRN: [id number]  CC:  Chief Complaint  Patient presents with  . Diabetes  . Hyperlipidemia  . Gastroesophageal Reflux    HPI Brian Pittman presents for follow up of diabetes, hyperlidemia.  Diabetes Mellitus Patient presents for follow up of diabetes. No complications.  Current symptoms include: none.  Patient denies polydipsia, polyphagia, polyuria, or neuropathy. Home sugars: 80-110. Checking sugars once daily. Current treatment:qtern 10/5 mg once daily in am and glimeperide 4 mg one twice a day. Last dilated eye exam: overdue.  Patient checks feet daily. Hyperlipidemia being treated with rosuvastatin. Patient is eating healthy and very active in his jobs.  GAD: Patient is well controlled on lorazepam 0.5 mg one to two times daily as needed for anxiety.    Sleep Apnea He presents for follow up OSA. Patient has been doing well with weight loss. Did not warrant cpap at that time.     Past Medical History:  Diagnosis Date  . Body mass index (BMI) 31.0-31.9, adult   . DM2 (diabetes mellitus, type 2) ([place])   . Ear infection   . Generalized anxiety disorder 06/28/2018  . GERD (gastroesophageal reflux disease)   . Hypercholesterolemia   . Panic disorder (episodic paroxysmal anxiety) 09/14/2016  . Pneumonia   . Sleep apnea   . Trochanteric bursitis of right hip 05/17/2018    Past Surgical History:  Procedure Laterality Date  . CHOLECYSTECTOMY      Family History  Problem Relation Age of Onset  . Diabetes Mother   . Diabetes Father   . Leukemia Father   . Skin cancer Father   . Heart attack Father   . Atrial fibrillation Father   . Lung cancer Maternal Grandfather   . Heart attack Paternal Grandmother   . Stroke Paternal Grandfather   . GER disease Sister   . Arthritis Sister     Social History   Socioeconomic History  . Marital status: Single    Spouse  name: Not on file  . Number of children: Not on file  . Years of education: Not on file  . Highest education level: Not on file  Occupational History  . Occupation: Education officer, community: [place]  Tobacco Use  . Smoking status: Never Smoker  . Smokeless tobacco: Former Systems developer    Types: Snuff  Substance and Sexual Activity  . Alcohol use: Yes    Comment: drinks alchohol on a regular basis, typically 1 beer daily  . Drug use: No  . Sexual activity: Not on file  Other Topics Concern  . Not on file  Social History Narrative  . Not on file   Social Determinants of Health   Financial Resource Strain:   . Difficulty of Paying Living Expenses: Not on file  Food Insecurity:   . Worried About Charity fundraiser in the Last Year: Not on file  . Ran Out of Food in the Last Year: Not on file  Transportation Needs:   . Lack of Transportation (Medical): Not on file  . Lack of Transportation (Non-Medical): Not on file  Physical Activity:   . Days of Exercise per Week: Not on file  . Minutes of Exercise per Session: Not on file  Stress:   . Feeling of Stress : Not on file  Social Connections:   . Frequency of Communication with Friends and  Family: Not on file  . Frequency of Social Gatherings with Friends and Family: Not on file  . Attends Religious Services: Not on file  . Active Member of Clubs or Organizations: Not on file  . Attends Banker Meetings: Not on file  . Marital Status: Not on file  Intimate Partner Violence:   . Fear of Current or Ex-Partner: Not on file  . Emotionally Abused: Not on file  . Physically Abused: Not on file  . Sexually Abused: Not on file    Outpatient Medications Prior to Visit  Medication Sig Dispense Refill  . LORazepam (ATIVAN) 0.5 MG tablet Take 0.5 mg by mouth every 8 (eight) hours.    [name] glimepiride (AMARYL) 2 MG tablet Take 1 tablet by mouth 2 (two) times daily.    [name] omeprazole (PRILOSEC) 20 MG capsule Take  20 mg by mouth daily.    [name] QTERN 10-5 MG TABS Take 1 tablet by mouth daily.    . rosuvastatin (CRESTOR) 10 MG tablet Take 10 mg by mouth daily.    . meloxicam (MOBIC) 15 MG tablet Take 15 mg by mouth daily as needed.    . metoprolol tartrate (LOPRESSOR) 50 MG tablet Take 2 tablets (100 mg total) by mouth once for 1 dose. Take 2 hours before your cardiac CTA. 2 tablet 0   No facility-administered medications prior to visit.    Allergies  Allergen Reactions  . Jardiance [Empagliflozin]   . Metformin Diarrhea  . Ozempic (0.25 Or 0.5 Mg-Dose) [Semaglutide(0.25 Or 0.5mg -Dos)] Nausea Only  . Penicillins Hives and Itching  . Prednisone     Elevates blood surgar, increased heart rate  . Ertugliflozin Rash  . Lovastatin Rash   ROS: .general Review of Systems  Constitutional: Negative for chills, fatigue and fever.  HENT: Positive for ear pain (left). Negative for congestion and sore throat.   Respiratory: Negative for cough and shortness of breath.   Cardiovascular: Negative for chest pain.  Gastrointestinal: Negative for abdominal pain, constipation, diarrhea, nausea and vomiting.  Endocrine: Negative for polydipsia, polyphagia and polyuria.  Genitourinary: Negative for dysuria and frequency.  Musculoskeletal: Negative for arthralgias and myalgias.  Neurological: Negative for dizziness and headaches.  Psychiatric/Behavioral: Negative for dysphoric mood.      Objective:    Physical Exam  Constitutional: He is oriented to person, place, and time. He appears well-developed and well-nourished.  Cardiovascular: Normal rate, regular rhythm, normal heart sounds and normal pulses.  No murmur heard. Pulmonary/Chest: Effort normal and breath sounds normal. No respiratory distress. He has no wheezes. He has no rales.  Abdominal: Soft. Bowel sounds are normal. There is no abdominal tenderness.  Musculoskeletal:        General: No edema. Normal range of motion.  Neurological: He is alert and  oriented to person, place, and time. No sensory deficit.  Skin: No lesion noted.  Psychiatric: He has a normal mood and affect.    BP 110/70 (BP Location: Left Arm)   Pulse 60   Temp 97.7 F (36.5 C)   Resp 16   Wt 209 lb 6.4 oz (95 kg)   BMI 30.05 kg/m  Wt Readings from Last 3 Encounters:  08/21/19 209 lb 6.4 oz (95 kg)  06/13/19 211 lb 6.4 oz (95.9 kg)  08/04/19 212 lb (96.2 kg)     Health Maintenance Due  Topic Date Due  . FOOT EXAM  10/25/1976  . OPHTHALMOLOGY EXAM  10/25/1976  . HIV Screening  10/25/1981  .  COLONOSCOPY  10/25/2016    There are no preventive care reminders to display for this patient.  No results found for: TSH Lab Results  Component Value Date   WBC 7.6 08/21/2019   HGB 16.5 08/21/2019   HCT 47.2 08/21/2019   MCV 87 08/21/2019   PLT 341 08/21/2019   Lab Results  Component Value Date   NA 140 08/21/2019   K 4.7 08/21/2019   CO2 23 08/21/2019   GLUCOSE 119 (H) 08/21/2019   BUN 16 08/21/2019   CREATININE 0.78 08/21/2019   BILITOT 0.4 08/21/2019   ALKPHOS 67 08/21/2019   AST 21 08/21/2019   ALT 34 08/21/2019   PROT 6.9 08/21/2019   ALBUMIN 4.7 08/21/2019   CALCIUM 9.6 08/21/2019   Lab Results  Component Value Date   CHOL 114 08/21/2019   Lab Results  Component Value Date   HDL 35 (L) 08/21/2019   Lab Results  Component Value Date   LDLCALC 59 08/21/2019   Lab Results  Component Value Date   TRIG 108 08/21/2019   Lab Results  Component Value Date   CHOLHDL 3.3 08/21/2019   Lab Results  Component Value Date   HGBA1C 7.1 (H) 08/21/2019      Assessment & Plan:   Problem List Items Addressed This Visit      Respiratory   Sleep apnea     Digestive   GERD (gastroesophageal reflux disease)     Endocrine   DM2 (diabetes mellitus, type 2) ([facility])     Other   Hypercholesterolemia - Primary   Relevant Orders   Lipid Panel (Completed)   Comprehensive metabolic panel (Completed)      No orders of the defined types  were placed in this encounter.   Follow-up: Return in about 3 months (around 11/18/2019).    Blane Ohara, MD

## 2019-08-20 NOTE — Progress Notes (Signed)
Subjective:     HPI: Brian Pittman is a 54 y.o. male here for follow up of dyslipidemia. The patient does not use medications that may worsen dyslipidemias (corticosteroids, progestins, anabolic steroids, diuretics, beta-blockers, amiodarone, cyclosporine, olanzapine). The patient exercises daily. The patient is not known to have coexisting coronary artery disease.  Diabetes He presents for his follow-up diabetic visit. He has type 2 diabetes mellitus. His disease course has been worsening. Pertinent negatives for hypoglycemia include no dizziness. Pertinent negatives for diabetes include no chest pain, no foot paresthesias, no polydipsia, no polyphagia and no polyuria. Current diabetic treatment includes oral agent (triple therapy). He is following a diabetic diet. He participates in exercise daily. His breakfast blood glucose range is generally 90-110 mg/dl. He does not see a podiatrist.Eye exam is not current.  Hyperlipidemia This is a chronic problem. The current episode started more than 1 year ago. The problem is controlled. There are no known factors aggravating his hyperlipidemia. Pertinent negatives include no chest pain or shortness of breath. Current antihyperlipidemic treatment includes statins (Crestor). There are no compliance problems.   Gastroesophageal Reflux He reports no chest pain, no coughing or no nausea. This is a chronic problem. The current episode started more than 1 year ago. The problem occurs rarely. He has tried a PPI for the symptoms.    Review of Systems: Review of Systems  Constitutional: Negative for chills and fever.  HENT: Positive for ear pain.   Respiratory: Negative for cough and shortness of breath.   Cardiovascular: Negative for chest pain and leg swelling.  Gastrointestinal: Negative for constipation, diarrhea, nausea and vomiting.  Genitourinary: Negative for frequency and urgency.  Neurological: Negative for dizziness.  Endo/Heme/Allergies:  Negative for polydipsia and polyphagia.  Psychiatric/Behavioral: Negative for depression.         Objective:    Vitals:   08/21/19 0743  BP: 110/70  Pulse: 60  Resp: 16  Temp: 97.7 F (36.5 C)    Physical Exam: Physical Exam Constitutional:      Appearance: Normal appearance.  Cardiovascular:     Rate and Rhythm: Normal rate and regular rhythm.  Pulmonary:     Effort: Pulmonary effort is normal.     Breath sounds: Normal breath sounds.  Abdominal:     General: Abdomen is flat. Bowel sounds are normal.     Palpations: Abdomen is soft.     Tenderness: There is no abdominal tenderness.  Neurological:     Mental Status: He is alert.  Psychiatric:        Mood and Affect: Mood normal.        Behavior: Behavior normal.      Physical Activity:   . Days of Exercise per Week: Not on file  . Minutes of Exercise per Session: Not on file      Lab Review Lab Results  Component Value Date   CHOL 114 08/21/2019   HDL 35 (L) 08/21/2019       Assessment:     Problem List Items Addressed This Visit      Respiratory   Sleep apnea     Digestive   GERD (gastroesophageal reflux disease)     Endocrine   DM2 (diabetes mellitus, type 2) ([place])     Other   Hypercholesterolemia - Primary   Relevant Orders   Lipid Panel (Completed)   Comprehensive metabolic panel (Completed)   Cardiovascular Risk Assessment (Completed)    Other Visit Diagnoses    GAD (generalized anxiety disorder)  Relevant Medications   LORazepam (ATIVAN) 0.5 MG tablet          Plan:    The following changes are planned for the 3 months at which time the patient will return for repeat fasting lipids:  1. Dietary recommendations: Mediterranean diet guidelines Reduce saturated fat, "trans" monounsaturated fatty acids, and cholesterol Extremely low-fat diet 2. 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  3. Other treatment: Treatment  of diabetes (continue qtern and glimeperide.)  Return in 3 months (on 11/18/2019).

## 2019-08-21 ENCOUNTER — Ambulatory Visit (INDEPENDENT_AMBULATORY_CARE_PROVIDER_SITE_OTHER): Payer: BC Managed Care – PPO | Admitting: Family Medicine

## 2019-08-21 ENCOUNTER — Encounter: Payer: Self-pay | Admitting: Family Medicine

## 2019-08-21 ENCOUNTER — Other Ambulatory Visit: Payer: Self-pay

## 2019-08-21 VITALS — BP 110/70 | HR 60 | Temp 97.7°F | Resp 16 | Ht 70.0 in | Wt 209.4 lb

## 2019-08-21 DIAGNOSIS — K219 Gastro-esophageal reflux disease without esophagitis: Secondary | ICD-10-CM | POA: Diagnosis not present

## 2019-08-21 DIAGNOSIS — E7800 Pure hypercholesterolemia, unspecified: Secondary | ICD-10-CM

## 2019-08-21 DIAGNOSIS — F411 Generalized anxiety disorder: Secondary | ICD-10-CM

## 2019-08-21 DIAGNOSIS — G4733 Obstructive sleep apnea (adult) (pediatric): Secondary | ICD-10-CM | POA: Diagnosis not present

## 2019-08-21 DIAGNOSIS — E1142 Type 2 diabetes mellitus with diabetic polyneuropathy: Secondary | ICD-10-CM | POA: Diagnosis not present

## 2019-08-21 LAB — POCT UA - MICROALBUMIN: Microalbumin Ur, POC: 10 mg/L

## 2019-08-21 NOTE — Patient Instructions (Addendum)
Diabetes well controlled. Diabetic diet and exercise. Continue current medicines.  Hyperlipidemia - Continue low fat diet and exercise.  OSA -Continue weight loss.  Brian Pittman continue omeprazole.  Place diabetes mellitus type 2 patient instructions here.  Place hyperlipidemia patient instructions here.    Diabetes Basics  Diabetes (diabetes mellitus) is a long-term (chronic) disease. It occurs when the body does not properly use sugar (glucose) that is released from food after you eat. Diabetes may be caused by one or both of these problems:  Your pancreas does not make enough of a hormone called insulin.  Your body does not react in a normal way to insulin that it makes. Insulin lets sugars (glucose) go into cells in your body. This gives you energy. If you have diabetes, sugars cannot get into cells. This causes high blood sugar (hyperglycemia). Follow these instructions at home: How is diabetes treated? You may need to take insulin or other diabetes medicines daily to keep your blood sugar in balance. Take your diabetes medicines every day as told by your doctor. List your diabetes medicines here: Diabetes medicines  Name of medicine: ______________________________ ? Amount (dose): _______________ Time (a.m./p.m.): _______________ Notes: ___________________________________  Name of medicine: ______________________________ ? Amount (dose): _______________ Time (a.m./p.m.): _______________ Notes: ___________________________________  Name of medicine: ______________________________ ? Amount (dose): _______________ Time (a.m./p.m.): _______________ Notes: ___________________________________ If you use insulin, you will learn how to give yourself insulin by injection. You may need to adjust the amount based on the food that you eat. List the types of insulin you use here: Insulin  Insulin type: ______________________________ ? Amount (dose): _______________ Time (a.m./p.m.):  _______________ Notes: ___________________________________  Insulin type: ______________________________ ? Amount (dose): _______________ Time (a.m./p.m.): _______________ Notes: ___________________________________  Insulin type: ______________________________ ? Amount (dose): _______________ Time (a.m./p.m.): _______________ Notes: ___________________________________  Insulin type: ______________________________ ? Amount (dose): _______________ Time (a.m./p.m.): _______________ Notes: ___________________________________  Insulin type: ______________________________ ? Amount (dose): _______________ Time (a.m./p.m.): _______________ Notes: ___________________________________ How do I manage my blood sugar?  Check your blood sugar levels using a blood glucose monitor as directed by your doctor. Your doctor will set treatment goals for you. Generally, you should have these blood sugar levels:  Before meals (preprandial): 80-130 mg/dL (4.4-7.2 mmol/L).  After meals (postprandial): below 180 mg/dL (10 mmol/L).  A1c level: less than 7%. Write down the times that you will check your blood sugar levels: Blood sugar checks  Time: _______________ Notes: ___________________________________  Time: _______________ Notes: ___________________________________  Time: _______________ Notes: ___________________________________  Time: _______________ Notes: ___________________________________  Time: _______________ Notes: ___________________________________  Time: _______________ Notes: ___________________________________  What do I need to know about low blood sugar? Low blood sugar is called hypoglycemia. This is when blood sugar is at or below 70 mg/dL (3.9 mmol/L). Symptoms may include:  Feeling: ? Hungry. ? Worried or nervous (anxious). ? Sweaty and clammy. ? Confused. ? Dizzy. ? Sleepy. ? Sick to your stomach (nauseous).  Having: ? A fast heartbeat. ? A headache. ? A change in  your vision. ? Tingling or no feeling (numbness) around the mouth, lips, or tongue. ? Jerky movements that you cannot control (seizure).  Having trouble with: ? Moving (coordination). ? Sleeping. ? Passing out (fainting). ? Getting upset easily (irritability). Treating low blood sugar To treat low blood sugar, eat or drink something sugary right away. If you can think clearly and swallow safely, follow the 15:15 rule:  Take 15 grams of a fast-acting carb (carbohydrate). Talk with your doctor about how much you should take.  Some fast-acting carbs are: ? Sugar tablets (glucose pills). Take 3-4 glucose pills. ? 6-8 pieces of hard candy. ? 4-6 oz (120-150 mL) of fruit juice. ? 4-6 oz (120-150 mL) of regular (not diet) soda. ? 1 Tbsp (15 mL) honey or sugar.  Check your blood sugar 15 minutes after you take the carb.  If your blood sugar is still at or below 70 mg/dL (3.9 mmol/L), take 15 grams of a carb again.  If your blood sugar does not go above 70 mg/dL (3.9 mmol/L) after 3 tries, get help right away.  After your blood sugar goes back to normal, eat a meal or a snack within 1 hour. Treating very low blood sugar If your blood sugar is at or below 54 mg/dL (3 mmol/L), you have very low blood sugar (severe hypoglycemia). This is an emergency. Do not wait to see if the symptoms will go away. Get medical help right away. Call your local emergency services (911 in the [place] Do not drive yourself to the hospital. Questions to ask your health care provider  Do I need to meet with a diabetes educator?  What equipment will I need to care for myself at home?  What diabetes medicines do I need? When should I take them?  How often do I need to check my blood sugar?  What number can I call if I have questions?  When is my next doctor's visit?  Where can I find a support group for people with diabetes? Where to find more information  American Diabetes Association:  www.diabetes.org  American Association of Diabetes Educators: www.diabeteseducator.org/patient-resources Contact a doctor if:  Your blood sugar is at or above 240 mg/dL (79.0 mmol/L) for 2 days in a row.  You have been sick or have had a fever for 2 days or more, and you are not getting better.  You have any of these problems for more than 6 hours: ? You cannot eat or drink. ? You feel sick to your stomach (nauseous). ? You throw up (vomit). ? You have watery poop (diarrhea). Get help right away if:  Your blood sugar is lower than 54 mg/dL (3 mmol/L).  You get confused.  You have trouble: ? Thinking clearly. ? Breathing. Summary  Diabetes (diabetes mellitus) is a long-term (chronic) disease. It occurs when the body does not properly use sugar (glucose) that is released from food after digestion.  Take insulin and diabetes medicines as told.  Check your blood sugar every day, as often as told.  Keep all follow-up visits as told by your doctor. This is important. This information is not intended to replace advice given to you by your health care provider. Make sure you discuss any questions you have with your health care provider. Document Revised: 03/29/2019 Document Reviewed: 10/08/2017 Elsevier Patient Education  2020 [employer].

## 2019-08-22 ENCOUNTER — Telehealth: Payer: Self-pay

## 2019-08-22 LAB — COMPREHENSIVE METABOLIC PANEL
ALT: 34 IU/L (ref 0–44)
AST: 21 IU/L (ref 0–40)
Albumin/Globulin Ratio: 2.1 (ref 1.2–2.2)
Albumin: 4.7 g/dL (ref 3.8–4.9)
Alkaline Phosphatase: 67 IU/L (ref 39–117)
BUN/Creatinine Ratio: 21 — ABNORMAL HIGH (ref 9–20)
BUN: 16 mg/dL (ref 6–24)
Bilirubin Total: 0.4 mg/dL (ref 0.0–1.2)
CO2: 23 mmol/L (ref 20–29)
Calcium: 9.6 mg/dL (ref 8.7–10.2)
Chloride: 102 mmol/L (ref 96–106)
Creatinine, Ser: 0.78 mg/dL (ref 0.76–1.27)
GFR calc Af Amer: 120 mL/min/{1.73_m2} (ref >59)
GFR calc non Af Amer: 104 mL/min/{1.73_m2} (ref >59)
Globulin, Total: 2.2 g/dL (ref 1.5–4.5)
Glucose: 119 mg/dL — ABNORMAL HIGH (ref 65–99)
Potassium: 4.7 mmol/L (ref 3.5–5.2)
Sodium: 140 mmol/L (ref 134–144)
Total Protein: 6.9 g/dL (ref 6.0–8.5)

## 2019-08-22 LAB — CBC WITH DIFFERENTIAL/PLATELET
Basophils Absolute: 0.1 10*3/uL (ref 0.0–0.2)
Basos: 1 %
EOS (ABSOLUTE): 0.1 10*3/uL (ref 0.0–0.4)
Eos: 2 %
Hematocrit: 47.2 % (ref 37.5–51.0)
Hemoglobin: 16.5 g/dL (ref 13.0–17.7)
Immature Grans (Abs): 0 10*3/uL (ref 0.0–0.1)
Immature Granulocytes: 1 %
Lymphocytes Absolute: 1.9 10*3/uL (ref 0.7–3.1)
Lymphs: 24 %
MCH: 30.4 pg (ref 26.6–33.0)
MCHC: 35 g/dL (ref 31.5–35.7)
MCV: 87 fL (ref 79–97)
Monocytes Absolute: 0.7 10*3/uL (ref 0.1–0.9)
Monocytes: 9 %
Neutrophils Absolute: 4.9 10*3/uL (ref 1.4–7.0)
Neutrophils: 63 %
Platelets: 341 10*3/uL (ref 150–450)
RBC: 5.42 x10E6/uL (ref 4.14–5.80)
RDW: 12.1 % (ref 11.6–15.4)
WBC: 7.6 10*3/uL (ref 3.4–10.8)

## 2019-08-22 LAB — LIPID PANEL
Chol/HDL Ratio: 3.3 ratio (ref 0.0–5.0)
Cholesterol, Total: 114 mg/dL (ref 100–199)
HDL: 35 mg/dL — ABNORMAL LOW (ref >39)
LDL Chol Calc (NIH): 59 mg/dL (ref 0–99)
Triglycerides: 108 mg/dL (ref 0–149)
VLDL Cholesterol Cal: 20 mg/dL (ref 5–40)

## 2019-08-22 LAB — HEMOGLOBIN A1C
Est. average glucose Bld gHb Est-mCnc: 157 mg/dL
Hgb A1c MFr Bld: 7.1 % — ABNORMAL HIGH (ref 4.8–5.6)

## 2019-08-22 LAB — CARDIOVASCULAR RISK ASSESSMENT

## 2019-08-25 NOTE — Telephone Encounter (Signed)
Made in error

## 2019-09-15 ENCOUNTER — Other Ambulatory Visit: Payer: Self-pay | Admitting: Family Medicine

## 2019-09-26 ENCOUNTER — Other Ambulatory Visit: Payer: Self-pay | Admitting: Physician Assistant

## 2019-09-26 ENCOUNTER — Other Ambulatory Visit: Payer: Self-pay | Admitting: Family Medicine

## 2019-11-16 NOTE — Progress Notes (Signed)
Cancelled. Dr. Deante Blough  

## 2019-11-20 ENCOUNTER — Ambulatory Visit (INDEPENDENT_AMBULATORY_CARE_PROVIDER_SITE_OTHER): Payer: BC Managed Care – PPO | Admitting: Family Medicine

## 2019-11-20 DIAGNOSIS — E1169 Type 2 diabetes mellitus with other specified complication: Secondary | ICD-10-CM

## 2019-11-20 DIAGNOSIS — E7800 Pure hypercholesterolemia, unspecified: Secondary | ICD-10-CM

## 2019-12-11 ENCOUNTER — Other Ambulatory Visit: Payer: Self-pay | Admitting: Family Medicine

## 2019-12-19 ENCOUNTER — Telehealth: Payer: Self-pay | Admitting: Cardiology

## 2019-12-19 ENCOUNTER — Other Ambulatory Visit: Payer: Self-pay | Admitting: Family Medicine

## 2019-12-19 DIAGNOSIS — E1169 Type 2 diabetes mellitus with other specified complication: Secondary | ICD-10-CM

## 2019-12-19 NOTE — Telephone Encounter (Signed)
   Pt said he needs a doctor who can help him manage is diabetes. He would like to know if Dr. Dulce Sellar can recommend one and send a referral for him  Please advise

## 2019-12-20 NOTE — Addendum Note (Signed)
Addended by: Delorse Limber I on: 12/20/2019 10:11 AM   Modules accepted: Orders

## 2019-12-20 NOTE — Progress Notes (Signed)
Subjective:     HPI: Brian Pittman is a 54 y.o. male here for follow up of Chronic follow up.   He has type 2 diabetes mellitus complicated by hyperlipidemia.  Pertinent negatives for diabetes include no chest pain, no foot paresthesias, no polydipsia, no polyphagia and no polyuria. Sugars 100-140s. Checking sugars once daily. Current diabetic treatment includes oral agent (triple therapy- glimeperide and qtern). He is following a diabetic diet. His job is very active. Has not seen an eye doctor in over year.   Hyperlipidemia This is a chronic problem. The current episode started more than 1 year ago. The problem is controlled. There are no known factors aggravating his hyperlipidemia. Pertinent negatives include no chest pain or shortness of breath. Current antihyperlipidemic treatment includes statins (Crestor 10 mg once daily). Has some myalgias. There are no compliance problems.    Gastroesophageal Reflux Has breakthrough reflux. Taking omeprazole 20 mg once daily. He thinks he needs it increased.   Review of Systems: Review of Systems  Constitutional: Negative for chills and fever.  HENT: Positive for ear pain (bilateral pain and pressure). Negative for congestion and sore throat.   Respiratory: Negative for cough and shortness of breath.   Cardiovascular: Negative for chest pain, palpitations and leg swelling.  Gastrointestinal: Positive for heartburn and nausea. Negative for constipation, diarrhea and vomiting.  Genitourinary: Negative for frequency and urgency.  Musculoskeletal: Myalgias: leg cramps.  Neurological: Positive for headaches (frontal headache). Negative for dizziness.  Endo/Heme/Allergies: Negative for polydipsia and polyphagia.  Psychiatric/Behavioral: Negative for depression. The patient is not nervous/anxious.        Objective:    Vitals:   12/22/19 0740  BP: 130/76  Pulse: 76  Resp: 16  Temp: (!) 97.4 F (36.3 C)   Physical Exam Constitutional:       Appearance: Normal appearance.  HENT:     Right Ear: Tympanic membrane, ear canal and external ear normal.     Left Ear: Tympanic membrane, ear canal and external ear normal.     Nose: Congestion present. No rhinorrhea.     Comments: BL Sinus tenderness.    Mouth/Throat:     Mouth: Mucous membranes are moist.     Pharynx: No oropharyngeal exudate or posterior oropharyngeal erythema.  Neck:     Vascular: No carotid bruit.  Cardiovascular:     Rate and Rhythm: Normal rate and regular rhythm.     Heart sounds: Normal heart sounds.  Pulmonary:     Effort: Pulmonary effort is normal. No respiratory distress.     Breath sounds: Normal breath sounds. No wheezing, rhonchi or rales.  Abdominal:     General: Abdomen is flat. Bowel sounds are normal.     Palpations: Abdomen is soft.     Tenderness: There is no abdominal tenderness.  Lymphadenopathy:     Cervical: No cervical adenopathy.  Neurological:     Mental Status: He is alert.  Psychiatric:        Mood and Affect: Mood normal.        Behavior: Behavior normal.    Diabetic Foot Exam - Simple   Simple Foot Form Diabetic Foot exam was performed with the following findings: Yes 12/20/2019  7:30 AM  Visual Inspection No deformities, no ulcerations, no other skin breakdown bilaterally: Yes Sensation Testing Intact to touch and monofilament testing bilaterally: Yes Pulse Check Posterior Tibialis and Dorsalis pulse intact bilaterally: Yes Comments      Physical Activity:   . Days of Exercise per  Week:   . Minutes of Exercise per Session:       Lab Review Lab Results  Component Value Date   CHOL 123 12/22/2019   CHOL 114 08/21/2019   HDL 35 (L) 12/22/2019   HDL 35 (L) 08/21/2019       Assessment:  1. Type 2 diabetes mellitus with other specified complication, without long-term current use of insulin ([place]) Fair control No changes to medicines.  Continue to work on eating a healthy diet and exercise.  Labs drawn today.   - CBC with Differential/Platelet - Comprehensive metabolic panel - Hemoglobin A1c  2. Mixed hyperlipidemia Well controlled.  No changes to medicines.  Continue to work on eating a healthy diet and exercise.  Labs drawn today.  - Lipid panel  3. Acute non-recurrent sinusitis of other sinus  Doxycycline  4. GERD - continue prilosec.  5. Class 1 obesity with serious comorbidities, bmi 30. Recommend continue to work on eating healthy diet and exercise.  Return in about 3 months (around 03/23/2020) for fasting.

## 2019-12-20 NOTE — Telephone Encounter (Signed)
Tried calling the patient. No answer but I did leave a message letting the patient know that we placed a referral for him to see the endocrinologist in [place] and that they should call him to schedule this appointment. I also gave our call back number for him to call us back if he had any other questions or concerns.

## 2019-12-20 NOTE — Telephone Encounter (Signed)
Please refer him to Endocrinology in [place]. Let him know that there is no endocrinologist in [facility]

## 2019-12-22 ENCOUNTER — Other Ambulatory Visit: Payer: Self-pay

## 2019-12-22 ENCOUNTER — Encounter: Payer: Self-pay | Admitting: Family Medicine

## 2019-12-22 ENCOUNTER — Ambulatory Visit (INDEPENDENT_AMBULATORY_CARE_PROVIDER_SITE_OTHER): Payer: BC Managed Care – PPO | Admitting: Family Medicine

## 2019-12-22 VITALS — BP 130/76 | HR 76 | Temp 97.4°F | Resp 16 | Ht 70.0 in | Wt 210.4 lb

## 2019-12-22 DIAGNOSIS — E1169 Type 2 diabetes mellitus with other specified complication: Secondary | ICD-10-CM

## 2019-12-22 DIAGNOSIS — J018 Other acute sinusitis: Secondary | ICD-10-CM | POA: Diagnosis not present

## 2019-12-22 DIAGNOSIS — Z6830 Body mass index (BMI) 30.0-30.9, adult: Secondary | ICD-10-CM

## 2019-12-22 DIAGNOSIS — E782 Mixed hyperlipidemia: Secondary | ICD-10-CM | POA: Diagnosis not present

## 2019-12-22 DIAGNOSIS — J019 Acute sinusitis, unspecified: Secondary | ICD-10-CM | POA: Insufficient documentation

## 2019-12-22 DIAGNOSIS — K219 Gastro-esophageal reflux disease without esophagitis: Secondary | ICD-10-CM

## 2019-12-22 DIAGNOSIS — E6609 Other obesity due to excess calories: Secondary | ICD-10-CM

## 2019-12-22 MED ORDER — DOXYCYCLINE HYCLATE 100 MG PO TABS: 100.0000 mg | ORAL_TABLET | Freq: Two times a day (BID) | ORAL | 0 refills | Status: DC

## 2019-12-22 NOTE — Patient Instructions (Signed)
[employer] COVERAGE SUPPORT Coverage doesn't have to be confusing. Our [employer] Prescription Coverage Counselors? can help answer questions about your prescription, co-pay, and benefit verification. Did you recently change your insurance plan? Our counselors can help figure out your new coverage, too.  Call [phone] to speak to one of our Prescription Coverage Counselors today, 9:00 AM to 6:00 PM EST, Monday - Friday.

## 2019-12-23 LAB — COMPREHENSIVE METABOLIC PANEL
ALT: 46 IU/L — ABNORMAL HIGH (ref 0–44)
AST: 23 IU/L (ref 0–40)
Albumin/Globulin Ratio: 2.5 — ABNORMAL HIGH (ref 1.2–2.2)
Albumin: 4.7 g/dL (ref 3.8–4.9)
Alkaline Phosphatase: 67 IU/L (ref 48–121)
BUN/Creatinine Ratio: 21 — ABNORMAL HIGH (ref 9–20)
BUN: 16 mg/dL (ref 6–24)
Bilirubin Total: 0.5 mg/dL (ref 0.0–1.2)
CO2: 24 mmol/L (ref 20–29)
Calcium: 9.8 mg/dL (ref 8.7–10.2)
Chloride: 101 mmol/L (ref 96–106)
Creatinine, Ser: 0.78 mg/dL (ref 0.76–1.27)
GFR calc Af Amer: 119 mL/min/{1.73_m2} (ref >59)
GFR calc non Af Amer: 103 mL/min/{1.73_m2} (ref >59)
Globulin, Total: 1.9 g/dL (ref 1.5–4.5)
Glucose: 119 mg/dL — ABNORMAL HIGH (ref 65–99)
Potassium: 5.1 mmol/L (ref 3.5–5.2)
Sodium: 138 mmol/L (ref 134–144)
Total Protein: 6.6 g/dL (ref 6.0–8.5)

## 2019-12-23 LAB — CBC WITH DIFFERENTIAL/PLATELET
Basophils Absolute: 0.1 10*3/uL (ref 0.0–0.2)
Basos: 1 %
EOS (ABSOLUTE): 0.1 10*3/uL (ref 0.0–0.4)
Eos: 2 %
Hematocrit: 45.6 % (ref 37.5–51.0)
Hemoglobin: 15.9 g/dL (ref 13.0–17.7)
Immature Grans (Abs): 0 10*3/uL (ref 0.0–0.1)
Immature Granulocytes: 1 %
Lymphocytes Absolute: 2.1 10*3/uL (ref 0.7–3.1)
Lymphs: 27 %
MCH: 30.5 pg (ref 26.6–33.0)
MCHC: 34.9 g/dL (ref 31.5–35.7)
MCV: 87 fL (ref 79–97)
Monocytes Absolute: 0.6 10*3/uL (ref 0.1–0.9)
Monocytes: 8 %
Neutrophils Absolute: 4.9 10*3/uL (ref 1.4–7.0)
Neutrophils: 61 %
Platelets: 298 10*3/uL (ref 150–450)
RBC: 5.22 x10E6/uL (ref 4.14–5.80)
RDW: 12.5 % (ref 11.6–15.4)
WBC: 7.9 10*3/uL (ref 3.4–10.8)

## 2019-12-23 LAB — HEMOGLOBIN A1C
Est. average glucose Bld gHb Est-mCnc: 157 mg/dL
Hgb A1c MFr Bld: 7.1 % — ABNORMAL HIGH (ref 4.8–5.6)

## 2019-12-23 LAB — LIPID PANEL
Chol/HDL Ratio: 3.5 ratio (ref 0.0–5.0)
Cholesterol, Total: 123 mg/dL (ref 100–199)
HDL: 35 mg/dL — ABNORMAL LOW (ref >39)
LDL Chol Calc (NIH): 72 mg/dL (ref 0–99)
Triglycerides: 77 mg/dL (ref 0–149)
VLDL Cholesterol Cal: 16 mg/dL (ref 5–40)

## 2019-12-23 LAB — CARDIOVASCULAR RISK ASSESSMENT

## 2019-12-30 DIAGNOSIS — E6609 Other obesity due to excess calories: Secondary | ICD-10-CM | POA: Insufficient documentation

## 2020-01-16 ENCOUNTER — Ambulatory Visit: Payer: BC Managed Care – PPO | Admitting: Internal Medicine

## 2020-02-01 ENCOUNTER — Other Ambulatory Visit: Payer: Self-pay | Admitting: Cardiology

## 2020-02-14 ENCOUNTER — Encounter: Payer: Self-pay | Admitting: Internal Medicine

## 2020-02-14 ENCOUNTER — Ambulatory Visit (INDEPENDENT_AMBULATORY_CARE_PROVIDER_SITE_OTHER): Payer: BC Managed Care – PPO | Admitting: Internal Medicine

## 2020-02-14 ENCOUNTER — Other Ambulatory Visit: Payer: Self-pay

## 2020-02-14 VITALS — BP 138/86 | HR 57 | Ht 70.0 in | Wt 204.2 lb

## 2020-02-14 DIAGNOSIS — E1169 Type 2 diabetes mellitus with other specified complication: Secondary | ICD-10-CM | POA: Diagnosis not present

## 2020-02-14 DIAGNOSIS — E1165 Type 2 diabetes mellitus with hyperglycemia: Secondary | ICD-10-CM | POA: Diagnosis not present

## 2020-02-14 DIAGNOSIS — E785 Hyperlipidemia, unspecified: Secondary | ICD-10-CM | POA: Diagnosis not present

## 2020-02-14 LAB — GLUCOSE, POCT (MANUAL RESULT ENTRY): POC Glucose: 147 mg/dl — AB (ref 70–99)

## 2020-02-14 MED ORDER — GLIPIZIDE 5 MG PO TABS: 5.0000 mg | ORAL_TABLET | Freq: Two times a day (BID) | ORAL | 3 refills | Status: DC

## 2020-02-14 NOTE — Patient Instructions (Addendum)
-   Stop Glimepiride  - Start Glipizide 5 mg, 1 tablet before Breakfast and 1 tablet before supper  - Continue Qtern 1 tablet with breakfast       Choose healthy, lower carb lower calorie snacks: toss salad,vegetables, cottage cheese, peanut butter, low fat cheese / string cheese, lower sodium deli meat, tuna salad or chicken salad     HOW TO TREAT LOW BLOOD SUGARS (Blood sugar LESS THAN 70 MG/DL)  Please follow the RULE OF 15 for the treatment of hypoglycemia treatment (when your (blood sugars are less than 70 mg/dL)    STEP 1: Take 15 grams of carbohydrates when your blood sugar is low, which includes:   3-4 GLUCOSE TABS  OR  3-4 OZ OF JUICE OR REGULAR SODA OR  ONE TUBE OF GLUCOSE GEL     STEP 2: RECHECK blood sugar in 15 MINUTES STEP 3: If your blood sugar is still low at the 15 minute recheck --> then, go back to STEP 1 and treat AGAIN with another 15 grams of carbohydrates.

## 2020-02-14 NOTE — Progress Notes (Signed)
Name: Brian Pittman  MRN/ DOB: [id number], 04-Feb-1966   Age/ Sex: 54 y.o., male    PCP: Blane Ohara, MD   Reason for Endocrinology Evaluation: Type 2 Diabetes Mellitus     Date of Initial Endocrinology Visit: 02/14/2020     PATIENT IDENTIFIER: Brian Pittman is a 54 y.o. male with a past medical history of T2DM and Dyslipidemia. The patient presented for initial endocrinology clinic visit on 02/14/2020 for consultative assistance with his diabetes management.    HPI: Mr. Markoff was    Diagnosed with DM 6 yrs ago Prior Medications tried/Intolerance: metformin - GI side effects . Ozempic/Trulicity - sour stomach and gas Currently checking blood sugars 1 x / day Hypoglycemia episodes : no              Hemoglobin A1c has been at 7.1 % in 2021 Patient required assistance for hypoglycemia: no Patient has required hospitalization within the last 1 year from hyper or hypoglycemia: no   In terms of diet, the patient eats 3 meals a day, snacks 3-4x a day - if he doesn't eat every 2-3 hours he would get sick . Drinks occasional sweet tea   HOME DIABETES REGIMEN: Amaryl 4 mg daily BID  Dapagliflozin/saxagliptin 10-5 mg daily     Statin: yes ACE-I/ARB: no Prior Diabetic Education: no   METER DOWNLOAD SUMMARY: Did not bring   DIABETIC COMPLICATIONS: Microvascular complications:   Denies: CKD, retinopathy,  Neuropathy  Last eye exam: Completed 2020  Macrovascular complications:  Denies: CAD, PVD, CVA   PAST HISTORY: Past Medical History:  Past Medical History:  Diagnosis Date  . Body mass index (BMI) 31.0-31.9, adult   . DM2 (diabetes mellitus, type 2) (HCC)   . Ear infection   . Generalized anxiety disorder 06/28/2018  . GERD (gastroesophageal reflux disease)   . Hypercholesterolemia   . Panic disorder (episodic paroxysmal anxiety) 09/14/2016  . Pneumonia   . Sleep apnea   . Trochanteric bursitis of right hip 05/17/2018   Past Surgical History:  Past Surgical  History:  Procedure Laterality Date  . CHOLECYSTECTOMY      Social History:  reports that he has never smoked. He quit smokeless tobacco use about 15 months ago.  His smokeless tobacco use included snuff. He reports current alcohol use. He reports that he does not use drugs. Family History:  Family History  Problem Relation Age of Onset  . Diabetes Mother   . Diabetes Father   . Leukemia Father   . Skin cancer Father   . Heart attack Father   . Atrial fibrillation Father   . Lung cancer Maternal Grandfather   . Heart attack Paternal Grandmother   . Leukemia Paternal Grandmother   . Stroke Paternal Grandfather   . Leukemia Paternal Grandfather   . GER disease Sister   . Arthritis Sister      HOME MEDICATIONS: Allergies as of 02/14/2020      Reactions   Jardiance [empagliflozin]    Metformin Diarrhea   Ozempic (0.25 Or 0.5 Mg-dose) [semaglutide(0.25 Or 0.5mg -dos)] Nausea Only   Penicillins Hives, Itching   Prednisone    Elevates blood surgar, increased heart rate   Ertugliflozin Rash   Lovastatin Rash      Medication List       Accurate as of February 14, 2020  7:34 AM. If you have any questions, ask your nurse or doctor.        doxycycline 100 MG tablet Commonly known as: VIBRA-TABS Take 1  tablet (100 mg total) by mouth 2 (two) times daily.   glimepiride 4 MG tablet Commonly known as: AMARYL TAKE 1 TABLET BY MOUTH TWICE DAILY   omeprazole 20 MG capsule Commonly known as: PRILOSEC TAKE 1 CAPSULE BY MOUTH DAILY BEFORE A MEAL   Qtern 10-5 MG Tabs Generic drug: Dapagliflozin-sAXagliptin TAKE 1 TABLET BY MOUTH ONCE DAILY IN THEMORNING   rosuvastatin 10 MG tablet Commonly known as: CRESTOR TAKE 1 TABLET BY MOUTH ONCE DAILY        ALLERGIES: Allergies  Allergen Reactions  . Jardiance [Empagliflozin]   . Metformin Diarrhea  . Ozempic (0.25 Or 0.5 Mg-Dose) [Semaglutide(0.25 Or 0.5mg -Dos)] Nausea Only  . Penicillins Hives and Itching  . Prednisone      Elevates blood surgar, increased heart rate  . Ertugliflozin Rash  . Lovastatin Rash     REVIEW OF SYSTEMS: A comprehensive ROS was conducted with the patient and is negative except as per HPI and below:  Review of Systems  Gastrointestinal: Positive for nausea. Negative for diarrhea.  Neurological: Positive for tingling.       Hands      OBJECTIVE:   VITAL SIGNS: BP (!) 138/86 (BP Location: Right Arm, Patient Position: Sitting, Cuff Size: Normal)   Pulse 57   Ht 5\' 10"  (1.778 m)   Wt (!) 204 lb 3.2 oz (92.6 kg)   SpO2 97%   BMI 29.30 kg/m    PHYSICAL EXAM:  General: Pt appears well and is in NAD  Neck: General: Supple without adenopathy or carotid bruits. Thyroid: Thyroid size normal.  No goiter or nodules appreciated. No thyroid bruit.  Lungs: Clear with good BS bilat with no rales, rhonchi, or wheezes  Heart: RRR with normal S1 and S2 and no gallops; no murmurs; no rub  Abdomen: Normoactive bowel sounds, soft, nontender, without masses or organomegaly palpable  Extremities:  Lower extremities - No pretibial edema. No lesions.  Skin: Normal texture and temperature to palpation.   Neuro: MS is good with appropriate affect, pt is alert and Ox3    DM foot exam: 02/14/2020  The skin of the feet is intact without sores or ulcerations. The pedal pulses are 2+ on right and 2+ on left. The sensation is intact to a screening 5.07, 10 gram monofilament bilaterally   DATA REVIEWED:  Lab Results  Component Value Date   HGBA1C 7.1 (H) 12/22/2019   HGBA1C 7.1 (H) 08/21/2019   Lab Results  Component Value Date   MICROALBUR 10 08/21/2019   LDLCALC 72 12/22/2019   CREATININE 0.78 12/22/2019    Lab Results  Component Value Date   CHOL 123 12/22/2019   HDL 35 (L) 12/22/2019   LDLCALC 72 12/22/2019   TRIG 77 12/22/2019   CHOLHDL 3.5 12/22/2019       In-Office BG 147 mg/dL  ASSESSMENT / PLAN / RECOMMENDATIONS:   1) Type 2 Diabetes Mellitus, Sub-optimally controlled,  Without complications - Most recent A1c of 7.1 %. Goal A1c < 7.0%.    Plan: GENERAL: I have discussed with the patient the pathophysiology of diabetes. We went over the natural progression of the disease. We talked about both insulin resistance and insulin deficiency. We stressed the importance of lifestyle changes including diet and exercise. I explained the complications associated with diabetes including retinopathy, nephropathy, neuropathy as well as increased risk of cardiovascular disease. We went over the benefit seen with glycemic control.   I have encouraged him to avoid sugar sweetened beverages , brisk walk ~  150 minutes per week. I have also advised him to avoid CHO for snacks, low carb options provided [name] is expensive, would like to change this eventually. Pt to contact insurance and obtain formulary  Intolerant to Metformin and GLP-1 agonists  Will switch Glimepiride to Glipizide    MEDICATIONS:  - Stop Glimepiride  - Start Glipizide 5 mg, 1 tablet before Breakfast and 1 tablet before supper  - Continue Qtern 1 tablet with breakfast   EDUCATION / INSTRUCTIONS: BG monitoring instructions: Patient is instructed to check his blood sugars 1 times a day, fasting Call [facility] if: BG persistently < 70  I reviewed the Rule of 15 for the treatment of hypoglycemia in detail with the patient. Literature supplied.   2) Diabetic complications:  Eye: Does not have known diabetic retinopathy.  Neuro/ Feet: Does not have known diabetic peripheral neuropathy. Renal: Patient does not have known baseline CKD. He is not on an ACEI/ARB at present.Up to date on MA/Cr ratio   3) Dyslipidemia: Pt on rosuvastatin 5 mg daily. LDL at goal . Discussed cardiovascular benefits.     F/U in 4 months         Signed electronically by: Lyndle Herrlich, MD  [facility]  [facility] [address] [place], [place]  [address] Phone: [phone] FAX: [phone]   CC: Blane Ohara, MD [address] [place] [address] Phone: [phone]  Fax: [phone]    Return to Endocrinology clinic as below: Future Appointments  Date Time Provider Department Center  03/29/2020  7:30 AM Cox, Fritzi Mandes, MD [facility] None

## 2020-03-14 ENCOUNTER — Other Ambulatory Visit: Payer: Self-pay | Admitting: Family Medicine

## 2020-03-14 ENCOUNTER — Other Ambulatory Visit: Payer: Self-pay | Admitting: Physician Assistant

## 2020-03-14 NOTE — Telephone Encounter (Signed)
LOOKS LIKE THIS PATIENT IS CHANGING PCP ON 9/9 ---Lorain Childes

## 2020-03-18 ENCOUNTER — Other Ambulatory Visit: Payer: Self-pay | Admitting: Cardiology

## 2020-03-21 ENCOUNTER — Ambulatory Visit: Payer: BC Managed Care – PPO | Admitting: Medical

## 2020-03-28 ENCOUNTER — Encounter: Payer: Self-pay | Admitting: Medical

## 2020-03-28 ENCOUNTER — Other Ambulatory Visit: Payer: Self-pay

## 2020-03-28 ENCOUNTER — Ambulatory Visit (INDEPENDENT_AMBULATORY_CARE_PROVIDER_SITE_OTHER): Payer: BC Managed Care – PPO | Admitting: Medical

## 2020-03-28 VITALS — BP 134/81 | HR 72 | Temp 98.2°F | Resp 18 | Ht 70.0 in | Wt 205.0 lb

## 2020-03-28 DIAGNOSIS — E782 Mixed hyperlipidemia: Secondary | ICD-10-CM

## 2020-03-28 DIAGNOSIS — Z0000 Encounter for general adult medical examination without abnormal findings: Secondary | ICD-10-CM

## 2020-03-28 DIAGNOSIS — K2100 Gastro-esophageal reflux disease with esophagitis, without bleeding: Secondary | ICD-10-CM | POA: Diagnosis not present

## 2020-03-28 DIAGNOSIS — E119 Type 2 diabetes mellitus without complications: Secondary | ICD-10-CM

## 2020-03-28 DIAGNOSIS — Z0100 Encounter for examination of eyes and vision without abnormal findings: Secondary | ICD-10-CM

## 2020-03-28 DIAGNOSIS — E1169 Type 2 diabetes mellitus with other specified complication: Secondary | ICD-10-CM | POA: Diagnosis not present

## 2020-03-28 DIAGNOSIS — K219 Gastro-esophageal reflux disease without esophagitis: Secondary | ICD-10-CM

## 2020-03-28 DIAGNOSIS — E1165 Type 2 diabetes mellitus with hyperglycemia: Secondary | ICD-10-CM

## 2020-03-28 DIAGNOSIS — Z125 Encounter for screening for malignant neoplasm of prostate: Secondary | ICD-10-CM | POA: Diagnosis not present

## 2020-03-28 DIAGNOSIS — J309 Allergic rhinitis, unspecified: Secondary | ICD-10-CM

## 2020-03-28 MED ORDER — AZELASTINE HCL 0.1 % NA SOLN: 2.0000 | Freq: Two times a day (BID) | NASAL | 1 refills | Status: DC

## 2020-03-28 MED ORDER — LEVOCETIRIZINE DIHYDROCHLORIDE 5 MG PO TABS: 5.0000 mg | ORAL_TABLET | Freq: Every evening | ORAL | 3 refills | Status: DC

## 2020-03-28 NOTE — Patient Instructions (Addendum)
For you wellness exam today I have ordered cbc, cmp, lipid panel, psa and a1c.  For diabetes follow up with Dr. Kelton Pillar. Will update her on your a1c.  Vaccine today none. But covid vaccine this sat. Recommend flu vaccine early oct.  Recommend exercise and healthy diet.  We will let you know lab results as they come in.  Follow up date appointment will be determined after lab review.   For allergic rhinitis xyzal anthistamine and astelin spray.   Preventive Care 31-78 Years Old, Male Preventive care refers to lifestyle choices and visits with your health care provider that can promote health and wellness. This includes:  A yearly physical exam. This is also called an annual well check.  Regular dental and eye exams.  Immunizations.  Screening for certain conditions.  Healthy lifestyle choices, such as eating a healthy diet, getting regular exercise, not using drugs or products that contain nicotine and tobacco, and limiting alcohol use. What can I expect for my preventive care visit? Physical exam Your health care provider will check:  Height and weight. These may be used to calculate body mass index (BMI), which is a measurement that tells if you are at a healthy weight.  Heart rate and blood pressure.  Your skin for abnormal spots. Counseling Your health care provider may ask you questions about:  Alcohol, tobacco, and drug use.  Emotional well-being.  Home and relationship well-being.  Sexual activity.  Eating habits.  Work and work Statistician. What immunizations do I need?  Influenza (flu) vaccine  This is recommended every year. Tetanus, diphtheria, and pertussis (Tdap) vaccine  You may need a Td booster every 10 years. Varicella (chickenpox) vaccine  You may need this vaccine if you have not already been vaccinated. Zoster (shingles) vaccine  You may need this after age 42. Measles, mumps, and rubella (MMR) vaccine  You may need at least one  dose of MMR if you were born in 1957 or later. You may also need a second dose. Pneumococcal conjugate (PCV13) vaccine  You may need this if you have certain conditions and were not previously vaccinated. Pneumococcal polysaccharide (PPSV23) vaccine  You may need one or two doses if you smoke cigarettes or if you have certain conditions. Meningococcal conjugate (MenACWY) vaccine  You may need this if you have certain conditions. Hepatitis A vaccine  You may need this if you have certain conditions or if you travel or work in places where you may be exposed to hepatitis A. Hepatitis B vaccine  You may need this if you have certain conditions or if you travel or work in places where you may be exposed to hepatitis B. Haemophilus influenzae type b (Hib) vaccine  You may need this if you have certain risk factors. Human papillomavirus (HPV) vaccine  If recommended by your health care provider, you may need three doses over 6 months. You may receive vaccines as individual doses or as more than one vaccine together in one shot (combination vaccines). Talk with your health care provider about the risks and benefits of combination vaccines. What tests do I need? Blood tests  Lipid and cholesterol levels. These may be checked every 5 years, or more frequently if you are over 87 years old.  Hepatitis C test.  Hepatitis B test. Screening  Lung cancer screening. You may have this screening every year starting at age 87 if you have a 30-pack-year history of smoking and currently smoke or have quit within the past 15 years.  Prostate cancer screening. Recommendations will vary depending on your family history and other risks.  Colorectal cancer screening. All adults should have this screening starting at age 26 and continuing until age 64. Your health care provider may recommend screening at age 31 if you are at increased risk. You will have tests every 1-10 years, depending on your results  and the type of screening test.  Diabetes screening. This is done by checking your blood sugar (glucose) after you have not eaten for a while (fasting). You may have this done every 1-3 years.  Sexually transmitted disease (STD) testing. Follow these instructions at home: Eating and drinking  Eat a diet that includes fresh fruits and vegetables, whole grains, lean protein, and low-fat dairy products.  Take vitamin and mineral supplements as recommended by your health care provider.  Do not drink alcohol if your health care provider tells you not to drink.  If you drink alcohol: ? Limit how much you have to 0-2 drinks a day. ? Be aware of how much alcohol is in your drink. In the [place] one drink equals one 12 oz bottle of beer (355 mL), one 5 oz glass of wine (148 mL), or one 1 oz glass of hard liquor (44 mL). Lifestyle  Take daily care of your teeth and gums.  Stay active. Exercise for at least 30 minutes on 5 or more days each week.  Do not use any products that contain nicotine or tobacco, such as cigarettes, e-cigarettes, and chewing tobacco. If you need help quitting, ask your health care provider.  If you are sexually active, practice safe sex. Use a condom or other form of protection to prevent STIs (sexually transmitted infections).  Talk with your health care provider about taking a low-dose aspirin every day starting at age 26. What's next?  Go to your health care provider once a year for a well check visit.  Ask your health care provider how often you should have your eyes and teeth checked.  Stay up to date on all vaccines. This information is not intended to replace advice given to you by your health care provider. Make sure you discuss any questions you have with your health care provider. Document Revised: 06/30/2018 Document Reviewed: 06/30/2018 Elsevier Patient Education  2020 [employer].

## 2020-03-28 NOTE — Progress Notes (Signed)
Pt cancelled. kc

## 2020-03-28 NOTE — Progress Notes (Signed)
Subjective:    Patient ID: Brian Pittman, male    DOB: Aug 15, 1965, 54 y.o.   MRN: [id number]  HPI  Pt in for 1st visit with me.  Pt works some Holiday representative, Social research officer, government and farming. Pt states moderate healthy diet but this time of year not eating the best. Pt is on glucotrol.     Pt sees Dr. Mathis Bud and Dr. Virgilio Frees.  Pt has hx of hyperlipidemia. Pt last a1c was 7.1. sugars have ranged 169-103.   Pt had work up by Dr. Dulce Sellar.  CT coronary. Pt is on crestor. Strong family his of CAD/  IMPRESSION: 1. No evidence of CAD, CADRADS = 0.  2. Coronary calcium score of 0. This was 0 percentile for age and sex matched control.  3. Normal coronary origin with right dominance.  Pt has some nasal congestion after working with hay recently. Does have hx of allergies. A lot moldy dusty environments. Symptoms for past 3 weeks.  Feels some pnd.  Hx of some reflux. Pt used prilosec. Symptoms more prominent at night. Reflux for 10 years.  Pt had colonoscopy at 54 years old.   Pt has had covid vaccine. Will get second this Saturday.    Review of Systems  Constitutional: Negative for chills, fatigue and fever.  HENT: Positive for congestion.        Nasal congestion. See hpi.  Respiratory: Negative for cough, chest tightness, shortness of breath and wheezing.   Cardiovascular: Negative for chest pain and palpitations.  Gastrointestinal: Negative for abdominal pain, diarrhea, nausea and vomiting.  Genitourinary: Negative for dysuria, flank pain and frequency.  Musculoskeletal: Negative for back pain and myalgias.  Skin: Negative for rash.  Neurological: Negative for dizziness and headaches.  Hematological: Negative for adenopathy. Does not bruise/bleed easily.  Psychiatric/Behavioral: Negative for behavioral problems, confusion, dysphoric mood and suicidal ideas. The patient is not nervous/anxious.    Past Medical History:  Diagnosis Date  . Body mass index (BMI) 31.0-31.9, adult   .  DM2 (diabetes mellitus, type 2) (HCC)   . Ear infection   . Generalized anxiety disorder 06/28/2018  . GERD (gastroesophageal reflux disease)   . Hypercholesterolemia   . Panic disorder (episodic paroxysmal anxiety) 09/14/2016  . Pneumonia   . Sleep apnea   . Trochanteric bursitis of right hip 05/17/2018     Social History   Socioeconomic History  . Marital status: Single    Spouse name: Not on file  . Number of children: Not on file  . Years of education: Not on file  . Highest education level: Not on file  Occupational History  . 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 Smoking status: Never Smoker  . Smokeless tobacco: Former Neurosurgeon    Types: Snuff  Vaping Use  . Vaping Use: Never used  Substance and Sexual Activity  . Alcohol use: Yes    Comment: drinks alchohol on a regular basis, typically 1 beer daily  . Drug use: No  . Sexual activity: Not on file  Other Topics Concern  . Not on file  Social History Narrative  . Not on file   Social Determinants of Health   Financial Resource Strain:   . Difficulty of Paying Living Expenses: Not on file  Food Insecurity:   . Worried About Programme researcher, broadcasting/film/video in the Last Year: Not on file  . Ran Out of Food in the Last Year: Not on file  Transportation  Needs:   . Lack of Transportation (Medical): Not on file  . Lack of Transportation (Non-Medical): Not on file  Physical Activity:   . Days of Exercise per Week: Not on file  . Minutes of Exercise per Session: Not on file  Stress:   . Feeling of Stress : Not on file  Social Connections:   . Frequency of Communication with Friends and Family: Not on file  . Frequency of Social Gatherings with Friends and Family: Not on file  . Attends Religious Services: Not on file  . Active Member of Clubs or Organizations: Not on file  . Attends Banker Meetings: Not on file  . Marital Status: Not on file  Intimate Partner Violence:    . Fear of Current or Ex-Partner: Not on file  . Emotionally Abused: Not on file  . Physically Abused: Not on file  . Sexually Abused: Not on file    Past Surgical History:  Procedure Laterality Date  . CHOLECYSTECTOMY      Family History  Problem Relation Age of Onset  . Diabetes Mother   . Diabetes Father   . Leukemia Father   . Skin cancer Father   . Heart attack Father   . Atrial fibrillation Father   . Lung cancer Maternal Grandfather   . Heart attack Paternal Grandmother   . Leukemia Paternal Grandmother   . Stroke Paternal Grandfather   . Leukemia Paternal Grandfather   . GER disease Sister   . Arthritis Sister     Allergies  Allergen Reactions  . Jardiance [Empagliflozin]   . Metformin Diarrhea  . Ozempic (0.25 Or 0.5 Mg-Dose) [Semaglutide(0.25 Or 0.5mg -Dos)] Nausea Only  . Penicillins Hives and Itching  . Prednisone     Elevates blood surgar, increased heart rate  . Ertugliflozin Rash  . Lovastatin Rash    Current Outpatient Medications on File Prior to Visit  Medication Sig Dispense Refill  . doxycycline (VIBRA-TABS) 100 MG tablet Take 1 tablet (100 mg total) by mouth 2 (two) times daily. (Patient not taking: Reported on 03/28/2020) 20 tablet 0  . glipiZIDE (GLUCOTROL) 5 MG tablet Take 1 tablet (5 mg total) by mouth 2 (two) times daily before a meal. 180 tablet 3  . omeprazole (PRILOSEC) 20 MG capsule TAKE 1 CAPSULE BY MOUTH DAILY BEFORE A MEAL 90 capsule 0  . QTERN 10-5 MG TABS TAKE 1 TABLET BY MOUTH ONCE DAILY IN THEMORNING 90 tablet 0  . rosuvastatin (CRESTOR) 10 MG tablet TAKE 1 TABLET BY MOUTH ONCE DAILY 90 tablet 0   No current facility-administered medications on file prior to visit.    BP 134/81   Pulse 72   Temp 98.2 F (36.8 C) (Oral)   Resp 18   Ht 5\' 10"  (1.778 m)   Wt 205 lb (93 kg)   SpO2 98%   BMI 29.41 kg/m       Objective:   Physical Exam  General Mental Status- Alert. General Appearance- Not in acute distress.    Skin General: Color- Normal Color. Moisture- Normal Moisture.  Neck Carotid Arteries- Normal color. Moisture- Normal Moisture. No carotid bruits. No JVD.  Chest and Lung Exam Auscultation: Breath Sounds:-Normal.  Cardiovascular Auscultation:Rythm- Regular. Murmurs & Other Heart Sounds:Auscultation of the heart reveals- No Murmurs.  Abdomen Inspection:-Inspeection Normal. Palpation/Percussion:Note:No mass. Palpation and Percussion of the abdomen reveal- Non Tender, Non Distended + BS, no rebound or guarding.  Neurologic Cranial Nerve exam:- CN III-XII intact(No nystagmus), symmetric smile. Strength:- 5/5 equal  and symmetric strength both upper and lower extremities.   heent- no current sinus pressure but hx of left frontal recently. Left ear canal scant wax tympanic membrane normal. No mastoid pain. No tragal tenderness. Rt ear normal. Mild pnd on throat exam. Mild boggy turbinates.     Assessment & Plan:  For you wellness exam today I have ordered cbc, cmp, lipid panel, psa and a1c.  For diabetes follow up with Dr. Lonzo Cloud. Will update her on your a1c.  Vaccine today none. But covid vaccine this sat. Recommend flu vaccine early oct.  Recommend exercise and healthy diet.  We will let you know lab results as they come in.  Follow up date appointment will be determined after lab review.   For allergic rhinitis xyzal anthistamine and astelin spray.  Esperanza Richters, PA-C

## 2020-03-29 ENCOUNTER — Ambulatory Visit (INDEPENDENT_AMBULATORY_CARE_PROVIDER_SITE_OTHER): Payer: BC Managed Care – PPO | Admitting: Family Medicine

## 2020-03-29 DIAGNOSIS — E1165 Type 2 diabetes mellitus with hyperglycemia: Secondary | ICD-10-CM

## 2020-03-29 DIAGNOSIS — E782 Mixed hyperlipidemia: Secondary | ICD-10-CM

## 2020-03-29 DIAGNOSIS — K219 Gastro-esophageal reflux disease without esophagitis: Secondary | ICD-10-CM

## 2020-03-29 LAB — CBC WITH DIFFERENTIAL/PLATELET
Absolute Monocytes: 792 cells/uL (ref 200–950)
Basophils Absolute: 71 cells/uL (ref 0–200)
Basophils Relative: 0.8 %
Eosinophils Absolute: 107 cells/uL (ref 15–500)
Eosinophils Relative: 1.2 %
HCT: 48.5 % (ref 38.5–50.0)
Hemoglobin: 16.6 g/dL (ref 13.2–17.1)
Lymphs Abs: 1647 cells/uL (ref 850–3900)
MCH: 30.3 pg (ref 27.0–33.0)
MCHC: 34.2 g/dL (ref 32.0–36.0)
MCV: 88.5 fL (ref 80.0–100.0)
MPV: 10.7 fL (ref 7.5–12.5)
Monocytes Relative: 8.9 %
Neutro Abs: 6283 cells/uL (ref 1500–7800)
Neutrophils Relative %: 70.6 %
Platelets: 324 10*3/uL (ref 140–400)
RBC: 5.48 10*6/uL (ref 4.20–5.80)
RDW: 12.1 % (ref 11.0–15.0)
Total Lymphocyte: 18.5 %
WBC: 8.9 10*3/uL (ref 3.8–10.8)

## 2020-03-29 LAB — COMPREHENSIVE METABOLIC PANEL
AG Ratio: 2.2 (calc) (ref 1.0–2.5)
ALT: 39 U/L (ref 9–46)
AST: 23 U/L (ref 10–35)
Albumin: 4.9 g/dL (ref 3.6–5.1)
Alkaline phosphatase (APISO): 67 U/L (ref 35–144)
BUN: 16 mg/dL (ref 7–25)
CO2: 26 mmol/L (ref 20–32)
Calcium: 10.1 mg/dL (ref 8.6–10.3)
Chloride: 103 mmol/L (ref 98–110)
Creat: 0.76 mg/dL (ref 0.70–1.33)
Globulin: 2.2 g/dL (calc) (ref 1.9–3.7)
Glucose, Bld: 138 mg/dL — ABNORMAL HIGH (ref 65–99)
Potassium: 5.4 mmol/L — ABNORMAL HIGH (ref 3.5–5.3)
Sodium: 139 mmol/L (ref 135–146)
Total Bilirubin: 0.7 mg/dL (ref 0.2–1.2)
Total Protein: 7.1 g/dL (ref 6.1–8.1)

## 2020-03-29 LAB — LIPID PANEL
Cholesterol: 99 mg/dL (ref <200)
HDL: 40 mg/dL (ref >=40)
LDL Cholesterol (Calc): 46 mg/dL (calc)
Non-HDL Cholesterol (Calc): 59 mg/dL (calc) (ref <130)
Total CHOL/HDL Ratio: 2.5 (calc) (ref <5.0)
Triglycerides: 58 mg/dL (ref <150)

## 2020-03-29 LAB — HEMOGLOBIN A1C
Hgb A1c MFr Bld: 7.5 % of total Hgb — ABNORMAL HIGH (ref <5.7)
Mean Plasma Glucose: 169 (calc)
eAG (mmol/L): 9.3 (calc)

## 2020-03-29 LAB — PSA: PSA: 0.6 ng/mL (ref <=4.0)

## 2020-04-01 ENCOUNTER — Encounter: Payer: Self-pay | Admitting: Gastroenterology

## 2020-04-24 ENCOUNTER — Other Ambulatory Visit: Payer: Self-pay | Admitting: Family Medicine

## 2020-04-26 NOTE — Telephone Encounter (Signed)
Medication: omeprazole (PRILOSEC) 20 MG capsule    Has the patient contacted their pharmacy? No. (If no, request that the patient contact the pharmacy for the refill.) (If yes, when and what did the pharmacy advise?)  Preferred Pharmacy (with phone number or street name): [facility] - [place], [place] - [address]  [address] [place], [place] [address]  Phone:  [phone] Fax:  [phone]  DEA #:  [id number]  Agent: Please be advised that RX refills may take up to 3 business days. We ask that you follow-up with your pharmacy.

## 2020-05-07 ENCOUNTER — Telehealth: Payer: Self-pay | Admitting: Medical

## 2020-05-07 MED ORDER — OMEPRAZOLE 40 MG PO CPDR: 40.0000 mg | DELAYED_RELEASE_CAPSULE | Freq: Every day | ORAL | 1 refills | Status: DC

## 2020-05-07 NOTE — Telephone Encounter (Signed)
Pt.notified

## 2020-05-07 NOTE — Telephone Encounter (Signed)
rx omeprazole sent to pt pharmacy. Sent in 40 mg dose since he takes 2-20 mg tabs daily. Well see if insurance covers 40 mg dose.

## 2020-05-07 NOTE — Telephone Encounter (Signed)
Patient states he takes this medication twice a day and needs another medication written to reflect his current dosage .    omeprazole (PRILOSEC) 20 MG capsule [[id number]]   [facility] - [place], [place] - [address]  [address] [place], [place] [address]  Phone:  [phone] Fax:  [phone]  DEA #:  [id number]   Please Advise

## 2020-05-07 NOTE — Telephone Encounter (Signed)
Let pt know I sent in 40 mg dose. Since he takes 2-20 mg tabs daily. If same price/insurance pays for better just to do 40 mg daily.

## 2020-05-09 ENCOUNTER — Ambulatory Visit (INDEPENDENT_AMBULATORY_CARE_PROVIDER_SITE_OTHER): Payer: BC Managed Care – PPO | Admitting: Gastroenterology

## 2020-05-09 ENCOUNTER — Encounter: Payer: Self-pay | Admitting: Gastroenterology

## 2020-05-09 VITALS — BP 124/80 | HR 79 | Ht 70.0 in | Wt 208.2 lb

## 2020-05-09 DIAGNOSIS — K219 Gastro-esophageal reflux disease without esophagitis: Secondary | ICD-10-CM

## 2020-05-09 NOTE — Patient Instructions (Signed)
If you are age 53 or older, your body mass index should be between 23-30. Your Body mass index is 29.88 kg/m. If this is out of the aforementioned range listed, please consider follow up with your Primary Care Provider.  If you are age 4 or younger, your body mass index should be between 19-25. Your Body mass index is 29.88 kg/m. If this is out of the aformentioned range listed, please consider follow up with your Primary Care Provider.   You have been scheduled for an endoscopy. Please follow written instructions given to you at your visit today. If you use inhalers (even only as needed), please bring them with you on the day of your procedure.  Please stop taking your PPI 7 days prior to procedure so stop taking starting 05/31/2020

## 2020-05-09 NOTE — Progress Notes (Signed)
Chief Complaint: GERD   Referring Provider:     Esperanza Richters, PA-C   HPI:     Brian Pittman is a 54 y.o. male with history of diabetes, hyperlipidemia, ccy, referred to the [facility] for evaluation of reflux.  Has had reflux sxs for many years, but worsenign sxs over last year. Index reflux symptoms include HB, regurgitation. Incrased post prandial and supine sxs, including choking sensation that wakes him from sleep. Started sleeping on right shoulder to improve sxs. No change with HOB elevated, and can frequently have sxs when in recliner. No dysphagia. Independent of food types; can occur after water.   Recent increase Prilosec from 20 mg to 40 mg/day due to increasing symptoms (just started today).   No previous EGD.  Up-to-date on colon cancer screening.  Completed colonoscopy at age 53 at outside facility; normal per patient with recommended repeat in 10 years.   Father and sister with reflux. Otherwise, no known family history of CRC, GI malignancy, liver disease, pancreatic disease, or IBD.    Past Medical History:  Diagnosis Date  . Body mass index (BMI) 31.0-31.9, adult   . DM2 (diabetes mellitus, type 2) (HCC)   . Ear infection   . Generalized anxiety disorder 06/28/2018  . GERD (gastroesophageal reflux disease)   . Hypercholesterolemia   . Panic disorder (episodic paroxysmal anxiety) 09/14/2016  . Pneumonia   . Sleep apnea   . Trochanteric bursitis of right hip 05/17/2018     Past Surgical History:  Procedure Laterality Date  . CHOLECYSTECTOMY    . COLONOSCOPY     Dr Rayfield Citizen around 2016 said it was normal   Family History  Problem Relation Age of Onset  . Diabetes Mother   . Diabetes Father   . Leukemia Father   . Skin cancer Father   . Heart attack Father   . Atrial fibrillation Father   . Lung cancer Maternal Grandfather   . Heart attack Paternal Grandmother   . Leukemia Paternal Grandmother   . Stroke Paternal  Grandfather   . Leukemia Paternal Grandfather   . GER disease Sister   . Arthritis Sister    Social History   Tobacco Use  . Smoking status: Never Smoker  . Smokeless tobacco: Former Neurosurgeon    Types: Snuff  Vaping Use  . Vaping Use: Never used  Substance Use Topics  . Alcohol use: Yes    Comment: drinks alchohol on a regular basis, typically 1 beer daily  . Drug use: No   Current Outpatient Medications  Medication Sig Dispense Refill  . azelastine (ASTELIN) 0.1 % nasal spray Place 2 sprays into both nostrils 2 (two) times daily. Use in each nostril as directed 30 mL 1  . glipiZIDE (GLUCOTROL) 5 MG tablet Take 1 tablet (5 mg total) by mouth 2 (two) times daily before a meal. 180 tablet 3  . levocetirizine (XYZAL) 5 MG tablet Take 1 tablet (5 mg total) by mouth every evening. 30 tablet 3  . omeprazole (PRILOSEC) 40 MG capsule Take 1 capsule (40 mg total) by mouth daily. 90 capsule 1  . QTERN 10-5 MG TABS TAKE 1 TABLET BY MOUTH ONCE DAILY IN THEMORNING 90 tablet 0  . rosuvastatin (CRESTOR) 10 MG tablet TAKE 1 TABLET BY MOUTH ONCE DAILY 90 tablet 0   No current facility-administered medications for this visit.   Allergies  Allergen Reactions  . Jardiance [Empagliflozin]   .  Metformin Diarrhea  . Ozempic (0.25 Or 0.5 Mg-Dose) [Semaglutide(0.25 Or 0.5mg -Dos)] Nausea Only  . Penicillins Hives and Itching  . Prednisone     Elevates blood surgar, increased heart rate  . Ertugliflozin Rash  . Lovastatin Rash     Review of Systems: All systems reviewed and negative except where noted in HPI.     Physical Exam:    Wt Readings from Last 3 Encounters:  05/09/20 208 lb 4 oz (94.5 kg)  03/28/20 205 lb (93 kg)  02/14/20 (!) 204 lb 3.2 oz (92.6 kg)    Ht 5\' 10"  (1.778 m)   Wt 208 lb 4 oz (94.5 kg)   BMI 29.88 kg/m  Constitutional:  Pleasant, in no acute distress. Psychiatric: Normal mood and affect. Behavior is normal. EENT: Pupils normal.  Conjunctivae are normal. No scleral  icterus. Neck supple. No cervical LAD. Cardiovascular: Normal rate, regular rhythm. No edema Pulmonary/chest: Effort normal and breath sounds normal. No wheezing, rales or rhonchi. Abdominal: Soft, nondistended, nontender. Bowel sounds active throughout. There are no masses palpable. No hepatomegaly. Neurological: Alert and oriented to person place and time. Skin: Skin is warm and dry. No rashes noted.   ASSESSMENT AND PLAN;   1) GERD: -Plan for EGD to evaluate for erosive esophagitis, LES laxity, hiatal hernia, along with Barrett's esophagus screening -Plan to hold PPI 7 days prior to EGD for assessment off acid suppression -Otherwise, plan to continue omeprazole 40 mg/day as currently prescribed (just started 40 mg today) and evaluate for efficacy -Continue antireflux lifestyle/dietary modifications  The indications, risks, and benefits of EGD were explained to the patient in detail. Risks include but are not limited to bleeding, perforation, adverse reaction to medications, and cardiopulmonary compromise. Sequelae include but are not limited to the possibility of surgery, hositalization, and mortality. The patient verbalized understanding and wished to proceed. All questions answered, referred to scheduler. Further recommendations pending results of the exam.     , DO, FACG  05/09/2020, 8:28 AM   [place], 05/11/2020, PA-C

## 2020-05-27 ENCOUNTER — Encounter: Payer: Self-pay | Admitting: Gastroenterology

## 2020-06-07 ENCOUNTER — Encounter: Payer: Self-pay | Admitting: Gastroenterology

## 2020-06-07 ENCOUNTER — Other Ambulatory Visit: Payer: Self-pay

## 2020-06-07 ENCOUNTER — Ambulatory Visit (AMBULATORY_SURGERY_CENTER): Payer: BC Managed Care – PPO | Admitting: Gastroenterology

## 2020-06-07 VITALS — BP 103/60 | HR 62 | Temp 98.0°F | Resp 20 | Ht 70.0 in | Wt 208.0 lb

## 2020-06-07 DIAGNOSIS — K2970 Gastritis, unspecified, without bleeding: Secondary | ICD-10-CM | POA: Diagnosis not present

## 2020-06-07 DIAGNOSIS — R12 Heartburn: Secondary | ICD-10-CM | POA: Diagnosis not present

## 2020-06-07 DIAGNOSIS — K317 Polyp of stomach and duodenum: Secondary | ICD-10-CM | POA: Diagnosis not present

## 2020-06-07 DIAGNOSIS — K219 Gastro-esophageal reflux disease without esophagitis: Secondary | ICD-10-CM

## 2020-06-07 MED ORDER — SODIUM CHLORIDE 0.9 % IV SOLN: 500.0000 mL | Freq: Once | INTRAVENOUS | Status: DC

## 2020-06-07 NOTE — Progress Notes (Signed)
A and O x3. Report to RN. Tolerated MAC anesthesia well.Teeth unchanged after procedure. 

## 2020-06-07 NOTE — Progress Notes (Signed)
Cw vitals and JD IV. 

## 2020-06-07 NOTE — Patient Instructions (Signed)
Resume Omeprazole 40 mg daily  Return to Gi clinic in 3 months - (make this appointment )- or sooner if needed   Handout on Gastritis given to you today  Await biopsy results    YOU HAD AN ENDOSCOPIC PROCEDURE TODAY AT THE [facility]:   Refer to the procedure report that was given to you for any specific questions about what was found during the examination.  If the procedure report does not answer your questions, please call your gastroenterologist to clarify.  If you requested that your care partner not be given the details of your procedure findings, then the procedure report has been included in a sealed envelope for you to review at your convenience later.  YOU SHOULD EXPECT: Some feelings of bloating in the abdomen. Passage of more gas than usual.  Walking can help get rid of the air that was put into your GI tract during the procedure and reduce the bloating. If you had a lower endoscopy (such as a colonoscopy or flexible sigmoidoscopy) you may notice spotting of blood in your stool or on the toilet paper. If you underwent a bowel prep for your procedure, you may not have a normal bowel movement for a few days.  Please Note:  You might notice some irritation and congestion in your nose or some drainage.  This is from the oxygen used during your procedure.  There is no need for concern and it should clear up in a day or so.  SYMPTOMS TO REPORT IMMEDIATELY:     Following upper endoscopy (EGD)  Vomiting of blood or coffee ground material  New chest pain or pain under the shoulder blades  Painful or persistently difficult swallowing  New shortness of breath  Fever of 100F or higher  Black, tarry-looking stools  For urgent or emergent issues, a gastroenterologist can be reached at any hour by calling [phone]. Do not use MyChart messaging for urgent concerns.    DIET:  We do recommend a small meal at first, but then you may proceed to your regular diet.   Drink plenty of fluids but you should avoid alcoholic beverages for 24 hours.  ACTIVITY:  You should plan to take it easy for the rest of today and you should NOT DRIVE or use heavy machinery until tomorrow (because of the sedation medicines used during the test).    FOLLOW UP: Our staff will call the number listed on your records 48-72 hours following your procedure to check on you and address any questions or concerns that you may have regarding the information given to you following your procedure. If we do not reach you, we will leave a message.  We will attempt to reach you two times.  During this call, we will ask if you have developed any symptoms of COVID 19. If you develop any symptoms (ie: fever, flu-like symptoms, shortness of breath, cough etc.) before then, please call [phone].  If you test positive for Covid 19 in the 2 weeks post procedure, please call and report this information to [place].    If any biopsies were taken you will be contacted by phone or by letter within the next 1-3 weeks.  Please call us at [phone] if you have not heard about the biopsies in 3 weeks.    SIGNATURES/CONFIDENTIALITY: You and/or your care partner have signed paperwork which will be entered into your electronic medical record.  These signatures attest to the fact that that the information above  on your After Visit Summary has been reviewed and is understood.  Full responsibility of the confidentiality of this discharge information lies with you and/or your care-partner.

## 2020-06-07 NOTE — Progress Notes (Signed)
Called to room to assist during endoscopic procedure.  Patient ID and intended procedure confirmed with present staff. Received instructions for my participation in the procedure from the performing physician.  

## 2020-06-07 NOTE — Op Note (Signed)
[facility] Patient Name: Brian DrumJeffrey Pittman Procedure Date: 06/07/2020 9:54 AM MRN: [id number] Endoscopist: Doristine LocksVito Promiss Pittman , MD Age: [age] Referring MD:  Date of Birth: 09/10/1965 Gender: Male Account #: [id number] Procedure:                Upper GI endoscopy Indications:              Heartburn, Suspected esophageal reflux Medicines:                Monitored Anesthesia Care Procedure:                Pre-Anesthesia Assessment:                           - Prior to the procedure, a History and Physical                            was performed, and patient medications and                            allergies were reviewed. The patient's tolerance of                            previous anesthesia was also reviewed. The risks                            and benefits of the procedure and the sedation                            options and risks were discussed with the patient.                            All questions were answered, and informed consent                            was obtained. Prior Anticoagulants: The patient has                            taken no previous anticoagulant or antiplatelet                            agents. ASA Grade Assessment: III - A patient with                            severe systemic disease. After reviewing the risks                            and benefits, the patient was deemed in                            satisfactory condition to undergo the procedure.                           After obtaining informed consent, the endoscope was  passed under direct vision. Throughout the                            procedure, the patient's blood pressure, pulse, and                            oxygen saturations were monitored continuously. The                            Endoscope was introduced through the mouth, and                            advanced to the second part of duodenum. The upper                            GI endoscopy was  accomplished without difficulty.                            The patient tolerated the procedure well. Scope In: Scope Out: Findings:                 The examined esophagus was normal.                           The Z-line was regular and was found 43 cm from the                            incisors.                           The gastroesophageal flap valve was visualized                            endoscopically and classified as Hill Grade I                            (prominent fold, tight to endoscope).                           Localized minimal inflammation characterized by                            erythema was found in the gastric antrum. Biopsies                            were taken with a cold forceps for Helicobacter                            pylori testing. Estimated blood loss was minimal.                           A few small sessile polyps with no bleeding and no                            stigmata of recent  bleeding were found in the                            gastric fundus and in the gastric body. Several of                            these polyps were removed with a cold biopsy                            forceps for histologic representative evaluation.                            Resection and retrieval were complete. Estimated                            blood loss was minimal.                           The examined duodenum was normal. Complications:            No immediate complications. Estimated Blood Loss:     Estimated blood loss was minimal. Impression:               - Normal esophagus.                           - Z-line regular, 43 cm from the incisors.                           - Gastroesophageal flap valve classified as Hill                            Grade I (prominent fold, tight to endoscope).                           - Gastritis. Biopsied.                           - A few gastric polyps. Resected and retrieved.                           - Normal  examined duodenum. Recommendation:           - Patient has a contact number available for                            emergencies. The signs and symptoms of potential                            delayed complications were discussed with the                            patient. Return to normal activities tomorrow.                            Written discharge instructions were provided to the  patient.                           - Resume previous diet.                           - Continue present medications.                           - Resume omeprazole 40 mg daily.                           - Await pathology results.                           - Return to GI clinic in 3 months or sooner as                            needed. Doristine Locks, MD 06/07/2020 10:14:14 AM

## 2020-06-11 ENCOUNTER — Telehealth: Payer: Self-pay

## 2020-06-11 ENCOUNTER — Other Ambulatory Visit: Payer: Self-pay | Admitting: Family Medicine

## 2020-06-11 NOTE — Telephone Encounter (Signed)
°  Follow up Call-  Call back number 06/07/2020  Post procedure Call Back phone  # [phone]  Permission to leave phone message Yes  Some recent data might be hidden     Patient questions:  Do you have a fever, pain , or abdominal swelling? No. Pain Score  0 *  Have you tolerated food without any problems? Yes.    Have you been able to return to your normal activities? Yes.    Do you have any questions about your discharge instructions: Diet   No. Medications  No. Follow up visit  No.  Do you have questions or concerns about your Care? No.  Actions: * If pain score is 4 or above: No action needed, pain <4. 1. Have you developed a fever since your procedure? no  2.   Have you had an respiratory symptoms (SOB or cough) since your procedure? no  3.   Have you tested positive for COVID 19 since your procedure no  4.   Have you had any family members/close contacts diagnosed with the COVID 19 since your procedure?  no   If yes to any of these questions please route to Laverna Peace, RN and Karlton Lemon, RN

## 2020-06-12 ENCOUNTER — Telehealth: Payer: Self-pay | Admitting: Medical

## 2020-06-12 MED ORDER — QTERN 10-5 MG PO TABS: ORAL_TABLET | ORAL | 0 refills | Status: DC

## 2020-06-12 MED ORDER — ROSUVASTATIN CALCIUM 10 MG PO TABS
10.0000 mg | ORAL_TABLET | Freq: Every day | ORAL | 0 refills | Status: DC
Start: 2020-06-12 — End: 2020-09-25

## 2020-06-12 NOTE — Telephone Encounter (Signed)
Medication: QTERN 10-5 MG TABS [[id number]]   rosuvastatin (CRESTOR) 10 MG tablet [[id number]]      Has the patient contacted their pharmacy? (If no, request that the patient contact the pharmacy for the refill.) (If yes, when and what did the pharmacy advise?)     Preferred Pharmacy (with phone number or street name): [facility] - [place], [place] - [address]  [address] [place], [place] [address]  Phone:  [phone] Fax:  [phone]      Agent: Please be advised that RX refills may take up to 3 business days. We ask that you follow-up with your pharmacy.

## 2020-06-12 NOTE — Telephone Encounter (Signed)
Rx sent 

## 2020-06-18 ENCOUNTER — Ambulatory Visit: Payer: BC Managed Care – PPO | Admitting: Internal Medicine

## 2020-06-18 ENCOUNTER — Encounter: Payer: Self-pay | Admitting: Gastroenterology

## 2020-06-25 ENCOUNTER — Ambulatory Visit: Payer: BC Managed Care – PPO | Admitting: Internal Medicine

## 2020-06-28 ENCOUNTER — Encounter: Payer: Self-pay | Admitting: Medical

## 2020-06-28 ENCOUNTER — Other Ambulatory Visit: Payer: Self-pay

## 2020-06-28 ENCOUNTER — Ambulatory Visit (INDEPENDENT_AMBULATORY_CARE_PROVIDER_SITE_OTHER): Payer: BC Managed Care – PPO | Admitting: Medical

## 2020-06-28 VITALS — BP 130/78 | HR 72 | Temp 98.1°F | Resp 18 | Ht 70.0 in | Wt 209.8 lb

## 2020-06-28 DIAGNOSIS — E1165 Type 2 diabetes mellitus with hyperglycemia: Secondary | ICD-10-CM

## 2020-06-28 DIAGNOSIS — E1169 Type 2 diabetes mellitus with other specified complication: Secondary | ICD-10-CM | POA: Diagnosis not present

## 2020-06-28 DIAGNOSIS — K219 Gastro-esophageal reflux disease without esophagitis: Secondary | ICD-10-CM

## 2020-06-28 DIAGNOSIS — E782 Mixed hyperlipidemia: Secondary | ICD-10-CM

## 2020-06-28 DIAGNOSIS — Z23 Encounter for immunization: Secondary | ICD-10-CM | POA: Diagnosis not present

## 2020-06-28 DIAGNOSIS — R5383 Other fatigue: Secondary | ICD-10-CM | POA: Diagnosis not present

## 2020-06-28 LAB — COMPREHENSIVE METABOLIC PANEL
ALT: 36 U/L (ref 0–53)
AST: 22 U/L (ref 0–37)
Albumin: 4.7 g/dL (ref 3.5–5.2)
Alkaline Phosphatase: 65 U/L (ref 39–117)
BUN: 17 mg/dL (ref 6–23)
CO2: 30 mEq/L (ref 19–32)
Calcium: 9.9 mg/dL (ref 8.4–10.5)
Chloride: 101 mEq/L (ref 96–112)
Creatinine, Ser: 0.75 mg/dL (ref 0.40–1.50)
GFR: 102.91 mL/min (ref >60.00)
Glucose, Bld: 135 mg/dL — ABNORMAL HIGH (ref 70–99)
Potassium: 5.5 mEq/L — ABNORMAL HIGH (ref 3.5–5.1)
Sodium: 138 mEq/L (ref 135–145)
Total Bilirubin: 0.7 mg/dL (ref 0.2–1.2)
Total Protein: 7 g/dL (ref 6.0–8.3)

## 2020-06-28 LAB — CBC WITH DIFFERENTIAL/PLATELET
Basophils Absolute: 0.1 10*3/uL (ref 0.0–0.1)
Basophils Relative: 0.8 % (ref 0.0–3.0)
Eosinophils Absolute: 0.1 10*3/uL (ref 0.0–0.7)
Eosinophils Relative: 1.4 % (ref 0.0–5.0)
HCT: 49.5 % (ref 39.0–52.0)
Hemoglobin: 16.5 g/dL (ref 13.0–17.0)
Lymphocytes Relative: 25 % (ref 12.0–46.0)
Lymphs Abs: 1.9 10*3/uL (ref 0.7–4.0)
MCHC: 33.2 g/dL (ref 30.0–36.0)
MCV: 89.7 fl (ref 78.0–100.0)
Monocytes Absolute: 0.6 10*3/uL (ref 0.1–1.0)
Monocytes Relative: 7.8 % (ref 3.0–12.0)
Neutro Abs: 4.9 10*3/uL (ref 1.4–7.7)
Neutrophils Relative %: 65 % (ref 43.0–77.0)
Platelets: 305 10*3/uL (ref 150.0–400.0)
RBC: 5.52 Mil/uL (ref 4.22–5.81)
RDW: 13.5 % (ref 11.5–15.5)
WBC: 7.5 10*3/uL (ref 4.0–10.5)

## 2020-06-28 LAB — LIPID PANEL
Cholesterol: 114 mg/dL (ref 0–200)
HDL: 33.7 mg/dL — ABNORMAL LOW (ref >39.00)
LDL Cholesterol: 61 mg/dL (ref 0–99)
NonHDL: 80.37
Total CHOL/HDL Ratio: 3
Triglycerides: 95 mg/dL (ref 0.0–149.0)
VLDL: 19 mg/dL (ref 0.0–40.0)

## 2020-06-28 LAB — VITAMIN B12: Vitamin B-12: 328 pg/mL (ref 211–911)

## 2020-06-28 LAB — HEMOGLOBIN A1C: Hgb A1c MFr Bld: 7.4 % — ABNORMAL HIGH (ref 4.6–6.5)

## 2020-06-28 LAB — T4, FREE: Free T4: 0.73 ng/dL (ref 0.60–1.60)

## 2020-06-28 LAB — TSH: TSH: 3.21 u[IU]/mL (ref 0.35–4.50)

## 2020-06-28 MED ORDER — OMEPRAZOLE 40 MG PO CPDR: 40.0000 mg | DELAYED_RELEASE_CAPSULE | Freq: Every day | ORAL | 3 refills | Status: DC

## 2020-06-28 MED ORDER — LEVOCETIRIZINE DIHYDROCHLORIDE 5 MG PO TABS: 5.0000 mg | ORAL_TABLET | Freq: Every evening | ORAL | 11 refills | Status: DC

## 2020-06-28 NOTE — Patient Instructions (Signed)
History of diabetes with last A1c 7.5.  Recommend continuing medications that your endocrinologist has prescribed.  We will repeat A1c today and pass that along to Dr. Virgilio Frees her of your high deductible and cost issues.  Also that even if you do meet deductible that insurance indicated he will not pay for Qtern.  For hyperlipidemia repeat lipid panel today.  Continue low-cholesterol diet and Crestor.  For GERD refilled your omeprazole.  Print prescription given.  History of allergic rhinitis and controlled presently with Xyzal.  Print prescription given.  I did go ahead and place referral to endocrinologist as sounds like your insurance requires PCP referral.  Follow-up with endocrinologist as scheduled.  Follow-up date with me to be determined after lab review.

## 2020-06-28 NOTE — Addendum Note (Signed)
Addended by: Gwenevere Abbot on: 06/28/2020 09:04 AM   Modules accepted: Orders

## 2020-06-28 NOTE — Progress Notes (Signed)
Subjective:    Patient ID: Brian Pittman, male    DOB: 10-Apr-1966, 54 y.o.   MRN: [id number]  HPI  Pt in for follow up.  Pt has diabetes. Last a1c was 7.5 3 months. Pt states for 3 weeks he was out of state working in [place] and just could not eat healthy. Insurance issue recently covering for endocrine visit? I put in referral today. Previous cardiologist put in referral. I do agree with referral. Pt is on qtern and glucotrol.  Pt has high deductable.   Pt has high cholesterol level. He is on crestor 10 mg daily.   Pt has gerd and is on omeprazole.    Review of Systems  Constitutional: Negative for chills, fatigue and fever.  Respiratory: Negative for cough, chest tightness, shortness of breath and wheezing.   Cardiovascular: Negative for chest pain and palpitations.  Gastrointestinal: Negative for abdominal pain, anal bleeding, blood in stool and diarrhea.  Musculoskeletal: Negative for back pain.  Skin: Negative for rash.  Hematological: Negative for adenopathy. Does not bruise/bleed easily.  Psychiatric/Behavioral: Negative for behavioral problems, confusion, hallucinations and suicidal ideas. The patient is not nervous/anxious and is not hyperactive.     Past Medical History:  Diagnosis Date  . Body mass index (BMI) 31.0-31.9, adult   . DM2 (diabetes mellitus, type 2) (HCC)   . Ear infection   . Generalized anxiety disorder 06/28/2018  . GERD (gastroesophageal reflux disease)   . Hypercholesterolemia   . Panic disorder (episodic paroxysmal anxiety) 09/14/2016  . Pneumonia   . Sleep apnea   . Trochanteric bursitis of right hip 05/17/2018     Social History   Socioeconomic History  . Marital status: Single    Spouse name: Not on file  . Number of children: Not on file  . Years of education: Not on file  . Highest education level: Not on file  Occupational History  . 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 Smoking  status: Former Games developer  . Smokeless tobacco: Former Neurosurgeon    Types: Snuff    Quit date: 10/2018  . Tobacco comment: quit over 20 years ago  Vaping Use  . Vaping Use: Never used  Substance and Sexual Activity  . Alcohol use: Yes    Comment: drinks alchohol on a regular basis, typically 1 beer daily  . Drug use: No  . Sexual activity: Not on file  Other Topics Concern  . Not on file  Social History Narrative  . Not on file   Social Determinants of Health   Financial Resource Strain: Not on file  Food Insecurity: Not on file  Transportation Needs: Not on file  Physical Activity: Not on file  Stress: Not on file  Social Connections: Not on file  Intimate Partner Violence: Not on file    Past Surgical History:  Procedure Laterality Date  . CHOLECYSTECTOMY    . COLONOSCOPY     Dr Rayfield Citizen around 2016 said it was normal    Family History  Problem Relation Age of Onset  . Diabetes Mother   . Diabetes Father   . Leukemia Father   . Skin cancer Father   . Heart attack Father   . Atrial fibrillation Father   . Lung cancer Maternal Grandfather   . Heart attack Paternal Grandmother   . Leukemia Paternal Grandmother   . Stroke Paternal Grandfather   . Leukemia Paternal Grandfather   . GER disease Sister   .  Arthritis Sister   . Colon cancer Neg Hx   . Esophageal cancer Neg Hx   . Stomach cancer Neg Hx     Allergies  Allergen Reactions  . Jardiance [Empagliflozin]   . Metformin Diarrhea  . Ozempic (0.25 Or 0.5 Mg-Dose) [Semaglutide(0.25 Or 0.5mg -Dos)] Nausea Only  . Penicillins Hives and Itching  . Prednisone     Elevates blood surgar, increased heart rate  . Ertugliflozin Rash  . Lovastatin Rash    Current Outpatient Medications on File Prior to Visit  Medication Sig Dispense Refill  . azelastine (ASTELIN) 0.1 % nasal spray Place 2 sprays into both nostrils 2 (two) times daily. Use in each nostril as directed 30 mL 1  . Dapagliflozin-sAXagliptin (QTERN) 10-5 MG  TABS TAKE 1 TABLET BY MOUTH ONCE DAILY IN THEMORNING 90 tablet 0  . glipiZIDE (GLUCOTROL) 5 MG tablet Take 1 tablet (5 mg total) by mouth 2 (two) times daily before a meal. 180 tablet 3  . levocetirizine (XYZAL) 5 MG tablet Take 1 tablet (5 mg total) by mouth every evening. 30 tablet 3  . omeprazole (PRILOSEC) 40 MG capsule Take 1 capsule (40 mg total) by mouth daily. 90 capsule 1  . rosuvastatin (CRESTOR) 10 MG tablet Take 1 tablet (10 mg total) by mouth daily. 90 tablet 0   No current facility-administered medications on file prior to visit.    BP 138/80   Pulse 72   Temp 98.1 F (36.7 C) (Oral)   Resp 18   Ht 5\' 10"  (1.778 m)   Wt 209 lb 12.8 oz (95.2 kg)   SpO2 97%   BMI 30.10 kg/m       Objective:   Physical Exam  General Mental Status- Alert. General Appearance- Not in acute distress.   Skin General: Color- Normal Color. Moisture- Normal Moisture.  Neck Carotid Arteries- Normal color. Moisture- Normal Moisture. No carotid bruits. No JVD.  Chest and Lung Exam Auscultation: Breath Sounds:-Normal.  Cardiovascular Auscultation:Rythm- Regular. Murmurs & Other Heart Sounds:Auscultation of the heart reveals- No Murmurs.  Abdomen Inspection:-Inspeection Normal. Palpation/Percussion:Note:No mass. Palpation and Percussion of the abdomen reveal- Non Tender, Non Distended + BS, no rebound or guarding.    Neurologic Cranial Nerve exam:- CN III-XII intact(No nystagmus), symmetric smile. Strength:- 5/5 equal and symmetric strength both upper and lower extremities.     Assessment & Plan:  History of diabetes with last A1c 7.5.  Recommend continuing medications that your endocrinologist has prescribed.  We will repeat A1c today and pass that along to Dr. her of your high deductible and cost issues.  Also that even if you do meet deductible that insurance indicated he will not pay for Qtern.  For hyperlipidemia repeat lipid panel today.  Continue low-cholesterol  diet and Crestor.  For GERD refilled your omeprazole.  Print prescription given.  History of allergic rhinitis and controlled presently with Xyzal.  Print prescription given.  I did go ahead and place referral to endocrinologist as sounds like your insurance requires PCP referral.  Follow-up with endocrinologist as scheduled.  Follow-up date with me to be determined after lab review.  Virgilio Frees, PA-C

## 2020-06-29 ENCOUNTER — Telehealth: Payer: Self-pay | Admitting: Medical

## 2020-06-29 DIAGNOSIS — E875 Hyperkalemia: Secondary | ICD-10-CM

## 2020-06-29 MED ORDER — SODIUM POLYSTYRENE SULFONATE 15 GM/60ML PO SUSP: ORAL | 0 refills | Status: AC

## 2020-06-29 NOTE — Telephone Encounter (Signed)
Opened to review 

## 2020-06-29 NOTE — Telephone Encounter (Signed)
Future cmp placed. 

## 2020-07-02 ENCOUNTER — Telehealth: Payer: Self-pay

## 2020-07-02 LAB — VITAMIN D 1,25 DIHYDROXY
Vitamin D 1, 25 (OH)2 Total: 62 pg/mL (ref 18–72)
Vitamin D2 1, 25 (OH)2: <8 pg/mL
Vitamin D3 1, 25 (OH)2: 62 pg/mL

## 2020-07-02 NOTE — Telephone Encounter (Signed)
I called in kayexalate for high potassium. The prescription was sent to pt pharmacy. Not clear what the issue is? Is pt saying pharmacy does not have kayexalate?

## 2020-07-02 NOTE — Telephone Encounter (Signed)
Called pharmacy they stated they didn't receive script until 12/13 and they are ordering the medication and it should be in today , called pt and made him aware and told him to call back if he has any other issues.

## 2020-07-02 NOTE — Telephone Encounter (Signed)
PT is calling to check on his prescription that was not called it yet for his Potassium.  Telephone: [phone]

## 2020-07-04 LAB — VITAMIN B1: Vitamin B1 (Thiamine): 8 nmol/L (ref 8–30)

## 2020-07-11 ENCOUNTER — Other Ambulatory Visit: Payer: Self-pay

## 2020-07-11 ENCOUNTER — Other Ambulatory Visit (INDEPENDENT_AMBULATORY_CARE_PROVIDER_SITE_OTHER): Payer: BC Managed Care – PPO

## 2020-07-11 DIAGNOSIS — E875 Hyperkalemia: Secondary | ICD-10-CM

## 2020-07-11 LAB — COMPREHENSIVE METABOLIC PANEL
ALT: 37 U/L (ref 0–53)
AST: 18 U/L (ref 0–37)
Albumin: 4.6 g/dL (ref 3.5–5.2)
Alkaline Phosphatase: 62 U/L (ref 39–117)
BUN: 19 mg/dL (ref 6–23)
CO2: 31 mEq/L (ref 19–32)
Calcium: 9.8 mg/dL (ref 8.4–10.5)
Chloride: 102 mEq/L (ref 96–112)
Creatinine, Ser: 0.74 mg/dL (ref 0.40–1.50)
GFR: 103.3 mL/min (ref >60.00)
Glucose, Bld: 158 mg/dL — ABNORMAL HIGH (ref 70–99)
Potassium: 5 mEq/L (ref 3.5–5.1)
Sodium: 138 mEq/L (ref 135–145)
Total Bilirubin: 0.5 mg/dL (ref 0.2–1.2)
Total Protein: 6.8 g/dL (ref 6.0–8.3)

## 2020-07-23 ENCOUNTER — Ambulatory Visit: Payer: BC Managed Care – PPO | Admitting: Internal Medicine

## 2020-07-24 NOTE — Telephone Encounter (Signed)
Charted in error.

## 2020-08-06 ENCOUNTER — Ambulatory Visit: Payer: BC Managed Care – PPO | Admitting: Internal Medicine

## 2020-08-12 ENCOUNTER — Telehealth: Payer: Self-pay | Admitting: Medical

## 2020-08-12 ENCOUNTER — Telehealth: Payer: Self-pay

## 2020-08-12 NOTE — Telephone Encounter (Signed)
Patient would like lab results. Patient states he does not have my chart.

## 2020-08-12 NOTE — Telephone Encounter (Signed)
PA initiated via Covermymeds; KEY: [id number]. Awaiting determination.

## 2020-08-12 NOTE — Telephone Encounter (Signed)
PA approved. Effective from 08/12/2020 through 08/11/2021.

## 2020-08-12 NOTE — Telephone Encounter (Signed)
Pt called and labs were reviewed for K labs

## 2020-08-14 ENCOUNTER — Telehealth: Payer: Self-pay | Admitting: Medical

## 2020-08-14 MED ORDER — DAPAGLIFLOZIN PROPANEDIOL 10 MG PO TABS: 10.0000 mg | ORAL_TABLET | Freq: Every day | ORAL | 6 refills | Status: DC

## 2020-08-14 NOTE — Telephone Encounter (Signed)
Please advise 

## 2020-08-14 NOTE — Telephone Encounter (Signed)
Spoken to patient earlier today, he stated that he was afraid that Marcelline Deist may be too expensive as well. I was able to find a discount card for patient and left it in the front office for pick up.

## 2020-08-14 NOTE — Telephone Encounter (Signed)
Spoken to patient and notified Dr Shamleffer's comments. Verbalized understanding.   

## 2020-08-14 NOTE — Telephone Encounter (Signed)
Please ask him to STOP Qtern.  Start Farxiga 10 mg, 1 tablet with Breakfast  Continue Glipizide BID as previously prescribed.

## 2020-08-14 NOTE — Telephone Encounter (Addendum)
Patient was prescribed Qtern the pharmacist has been trying to help patient with discount with no success. Patient states the medication is $600.00 and he is not able to afford. Patient is wondering if maybe there is something else you could prescribe.   Would you forward this to Dr. Lonzo Cloud and her CMA. It looks like I prescribed when he was in office with me. But she had started him on med.  Give pt update on above.

## 2020-09-10 ENCOUNTER — Ambulatory Visit (INDEPENDENT_AMBULATORY_CARE_PROVIDER_SITE_OTHER): Payer: BC Managed Care – PPO | Admitting: Internal Medicine

## 2020-09-10 ENCOUNTER — Other Ambulatory Visit: Payer: Self-pay

## 2020-09-10 ENCOUNTER — Encounter: Payer: Self-pay | Admitting: Internal Medicine

## 2020-09-10 VITALS — BP 128/78 | HR 72 | Ht 70.0 in | Wt 207.0 lb

## 2020-09-10 DIAGNOSIS — E1165 Type 2 diabetes mellitus with hyperglycemia: Secondary | ICD-10-CM

## 2020-09-10 LAB — POCT GLYCOSYLATED HEMOGLOBIN (HGB A1C): Hemoglobin A1C: 8.1 % — AB (ref 4.0–5.6)

## 2020-09-10 LAB — POCT GLUCOSE (DEVICE FOR HOME USE): POC Glucose: 136 mg/dl — AB (ref 70–99)

## 2020-09-10 MED ORDER — GLIPIZIDE 5 MG PO TABS: 7.5000 mg | ORAL_TABLET | Freq: Two times a day (BID) | ORAL | 3 refills | Status: DC

## 2020-09-10 MED ORDER — DAPAGLIFLOZIN PROPANEDIOL 10 MG PO TABS: 10.0000 mg | ORAL_TABLET | Freq: Every day | ORAL | 6 refills | Status: DC

## 2020-09-10 NOTE — Progress Notes (Signed)
Name: Brian Pittman  Age/ Sex: 55 y.o., male   MRN/ DOB: [id number], 1966/02/23     PCP: [name]   Reason for Endocrinology Evaluation: Type 2 Diabetes Mellitus  Initial Endocrine Consultative Visit: 02/14/2020    PATIENT IDENTIFIER: Brian Pittman is a 55 y.o. male with a past medical history of T2DM and Dyslipidemia. The patient has followed with Endocrinology clinic since 02/14/2020 for consultative assistance with management of his diabetes.  DIABETIC HISTORY:  Mr. Trim was diagnosed with DM in 2015,metformin - GI side effects . Ozempic/Trulicity - sour stomach and gas . His hemoglobin A1c has ranged from 7.1% in 08/2019, peaking at 7.5% in 03/2020.  On his initial to our clinic he had an A1c of 7.1 %. He was on Amaryl and Qtern. We switched Glimepiride to Glipizide and continued Qtern but by 07/2020 [name] became cost prohibitve and started [place]   SUBJECTIVE:   During the last visit (02/14/2020): A1c 7.1 % We switched Glimepiride to Glipizide and continued Qtern      Today (09/10/2020): Brian Pittman is here for a follow up on diabetes.  He checks his blood sugars 2 times daily. The patient has not had hypoglycemic episodes since the last clinic visit.     Has occasional nausea - chronic secondary to GERD . Recent Endoscopy . Increased PPI   HOME DIABETES REGIMEN:  Glipizide 5 mg BID Farxiga 10 mg daily  Qtern 10/5 mg daily      Statin: Yes ACE-I/ARB: no   GLUCOSE LOG:  108 166 mg/dL     DIABETIC COMPLICATIONS: Microvascular complications:   Denies: CKD, neuropathy, retinopathy  Last Eye Exam: Completed 2020  Macrovascular complications:   Denies: CAD, CVA, PVD   HISTORY:  Past Medical History:  Past Medical History:  Diagnosis Date  . Body mass index (BMI) 31.0-31.9, adult   . DM2 (diabetes mellitus, type 2) (HCC)   . Ear infection   . Generalized anxiety disorder 06/28/2018  . GERD (gastroesophageal reflux disease)   .  Hypercholesterolemia   . Panic disorder (episodic paroxysmal anxiety) 09/14/2016  . Pneumonia   . Sleep apnea   . Trochanteric bursitis of right hip 05/17/2018   Past Surgical History:  Past Surgical History:  Procedure Laterality Date  . CHOLECYSTECTOMY    . COLONOSCOPY     Dr Rayfield Citizen around 2016 said it was normal   Social History:  reports that he has quit smoking. He quit smokeless tobacco use about 22 months ago.  His smokeless tobacco use included snuff. He reports current alcohol use. He reports that he does not use drugs. Family History:  Family History  Problem Relation Age of Onset  . Diabetes Mother   . Diabetes Father   . Leukemia Father   . Skin cancer Father   . Heart attack Father   . Atrial fibrillation Father   . Lung cancer Maternal Grandfather   . Heart attack Paternal Grandmother   . Leukemia Paternal Grandmother   . Stroke Paternal Grandfather   . Leukemia Paternal Grandfather   . GER disease Sister   . Arthritis Sister   . Colon cancer Neg Hx   . Esophageal cancer Neg Hx   . Stomach cancer Neg Hx      HOME MEDICATIONS: Allergies as of 09/10/2020      Reactions   Jardiance [empagliflozin]    Metformin Diarrhea   Ozempic (0.25 Or 0.5 Mg-dose) [semaglutide(0.25 Or 0.5mg -dos)] Nausea Only   Penicillins Hives, Itching  Prednisone    Elevates blood surgar, increased heart rate   Ertugliflozin Rash   Lovastatin Rash      Medication List       Accurate as of September 10, 2020  3:50 PM. If you have any questions, ask your nurse or doctor.        azelastine 0.1 % nasal spray Commonly known as: ASTELIN Place 2 sprays into both nostrils 2 (two) times daily. Use in each nostril as directed   dapagliflozin propanediol 10 MG Tabs tablet Commonly known as: Farxiga Take 1 tablet (10 mg total) by mouth daily.   glipiZIDE 5 MG tablet Commonly known as: GLUCOTROL Take 1.5 tablets (7.5 mg total) by mouth 2 (two) times daily before a meal. What  changed: how much to take Changed by: Scarlette Shorts, MD   levocetirizine 5 MG tablet Commonly known as: XYZAL Take 1 tablet (5 mg total) by mouth every evening. Can give 90 tab rx.   omeprazole 40 MG capsule Commonly known as: PRILOSEC Take 1 capsule (40 mg total) by mouth daily.   rosuvastatin 10 MG tablet Commonly known as: CRESTOR Take 1 tablet (10 mg total) by mouth daily.   sodium polystyrene 15 GM/60ML suspension Commonly known as: KAYEXALATE 60 ml twice daily for 3 days        OBJECTIVE:   Vital Signs: BP 128/78   Pulse 72   Ht 5\' 10"  (1.778 m)   Wt 207 lb (93.9 kg)   SpO2 98%   BMI 29.70 kg/m   Wt Readings from Last 3 Encounters:  09/10/20 207 lb (93.9 kg)  06/28/20 209 lb 12.8 oz (95.2 kg)  06/07/20 208 lb (94.3 kg)     Exam: General: Pt appears well and is in NAD  Lungs: Clear with good BS bilat with no rales, rhonchi, or wheezes  Heart: RRR   Abdomen: Normoactive bowel sounds, soft, nontender, without masses or organomegaly palpable  Extremities: No pretibial edema.   Neuro: MS is good with appropriate affect, pt is alert and Ox3     DM foot exam: 02/14/2020  The skin of the feet is intact without sores or ulcerations. The pedal pulses are 2+ on right and 2+ on left. The sensation is intact to a screening 5.07, 10 gram monofilament bilaterally      DATA REVIEWED:  Lab Results  Component Value Date   HGBA1C 8.1 (A) 09/10/2020   HGBA1C 7.4 (H) 06/28/2020   HGBA1C 7.5 (H) 03/28/2020   Lab Results  Component Value Date   MICROALBUR 10 08/21/2019   LDLCALC 61 06/28/2020   CREATININE 0.74 07/11/2020    Lab Results  Component Value Date   CHOL 114 06/28/2020   HDL 33.70 (L) 06/28/2020   LDLCALC 61 06/28/2020   TRIG 95.0 06/28/2020   CHOLHDL 3 06/28/2020         ASSESSMENT / PLAN / RECOMMENDATIONS:   1) Type 2 Diabetes Mellitus, Poorly controlled, Without complications - Most recent A1c of 8.1 %. Goal A1c < 7.0 %.      - Worsening A1c due to dietary indiscretions for a month while working  - Intolerant to Metformin and GLP-1 agonists  - I received a message that 14/04/2020 was cost prohibitive so we switched to [name]  But  After discounts they ended up being the same price and he opted for the [place]  - We discussed trying for [place] again with coupons, he has high deductible plan  - We also discussed  pioglitazone with risk of edema and weight gain  - After discussing the benefits of SGLT-2 inhibitors , he opted to stay on those as long as he is able   - In the meantime will increase Glipizide as below      MEDICATIONS: -  Increase  Glipizide 5 mg, 1.5 tablets before Breakfast and 1.5 tablets before supper  - Continue Qtern or Farxiga   EDUCATION / INSTRUCTIONS: BG monitoring instructions: Patient is instructed to check his blood sugars 1 times a day Call [facility] if: BG persistently < 70  I reviewed the Rule of 15 for the treatment of hypoglycemia in detail with the patient. Literature supplied.    2) Diabetic complications:  Eye: Does not have known diabetic retinopathy.  Neuro/ Feet: Does not have known diabetic peripheral neuropathy .  Renal: Patient does not have known baseline CKD. He   Is not  on an ACEI/ARB at present.      F/U in 4 months    Signed electronically by: Lyndle Herrlich, MD  [facility] Endocrinology  [facility] [address] [place] [place], [place] [address] Phone: [phone] FAX: [phone]   CC: [name] [address] [place] [address] Phone: [phone]  Fax: [phone]  Return to Endocrinology clinic as below: Future Appointments  Date Time Provider Department Center  09/26/2020  8:00 AM Saguier, Kateri Mc [id number]

## 2020-09-10 NOTE — Patient Instructions (Signed)
-   Increase  Glipizide 5 mg, 1.5 tablets before Breakfast and 1.5 tablets before supper  - Continue Qtern or Farxiga - if high cost , let me know       HOW TO TREAT LOW BLOOD SUGARS (Blood sugar LESS THAN 70 MG/DL)  Please follow the RULE OF 15 for the treatment of hypoglycemia treatment (when your (blood sugars are less than 70 mg/dL)    STEP 1: Take 15 grams of carbohydrates when your blood sugar is low, which includes:   3-4 GLUCOSE TABS  OR  3-4 OZ OF JUICE OR REGULAR SODA OR  ONE TUBE OF GLUCOSE GEL     STEP 2: RECHECK blood sugar in 15 MINUTES STEP 3: If your blood sugar is still low at the 15 minute recheck --> then, go back to STEP 1 and treat AGAIN with another 15 grams of carbohydrates.

## 2020-09-25 ENCOUNTER — Other Ambulatory Visit: Payer: Self-pay | Admitting: Medical

## 2020-09-26 ENCOUNTER — Ambulatory Visit (INDEPENDENT_AMBULATORY_CARE_PROVIDER_SITE_OTHER): Payer: BC Managed Care – PPO | Admitting: Medical

## 2020-09-26 ENCOUNTER — Other Ambulatory Visit: Payer: Self-pay

## 2020-09-26 VITALS — BP 119/59 | HR 61 | Resp 18 | Ht 70.0 in | Wt 205.6 lb

## 2020-09-26 DIAGNOSIS — E1165 Type 2 diabetes mellitus with hyperglycemia: Secondary | ICD-10-CM | POA: Diagnosis not present

## 2020-09-26 DIAGNOSIS — Z125 Encounter for screening for malignant neoplasm of prostate: Secondary | ICD-10-CM | POA: Diagnosis not present

## 2020-09-26 DIAGNOSIS — E782 Mixed hyperlipidemia: Secondary | ICD-10-CM | POA: Diagnosis not present

## 2020-09-26 DIAGNOSIS — Z0000 Encounter for general adult medical examination without abnormal findings: Secondary | ICD-10-CM

## 2020-09-26 DIAGNOSIS — R252 Cramp and spasm: Secondary | ICD-10-CM | POA: Diagnosis not present

## 2020-09-26 DIAGNOSIS — M2550 Pain in unspecified joint: Secondary | ICD-10-CM

## 2020-09-26 LAB — COMPREHENSIVE METABOLIC PANEL
ALT: 36 U/L (ref 0–53)
AST: 19 U/L (ref 0–37)
Albumin: 4.6 g/dL (ref 3.5–5.2)
Alkaline Phosphatase: 65 U/L (ref 39–117)
BUN: 16 mg/dL (ref 6–23)
CO2: 32 mEq/L (ref 19–32)
Calcium: 10 mg/dL (ref 8.4–10.5)
Chloride: 102 mEq/L (ref 96–112)
Creatinine, Ser: 0.75 mg/dL (ref 0.40–1.50)
GFR: 102.73 mL/min (ref >60.00)
Glucose, Bld: 143 mg/dL — ABNORMAL HIGH (ref 70–99)
Potassium: 5.2 mEq/L — ABNORMAL HIGH (ref 3.5–5.1)
Sodium: 139 mEq/L (ref 135–145)
Total Bilirubin: 0.6 mg/dL (ref 0.2–1.2)
Total Protein: 6.9 g/dL (ref 6.0–8.3)

## 2020-09-26 LAB — CBC WITH DIFFERENTIAL/PLATELET
Basophils Absolute: 0.1 10*3/uL (ref 0.0–0.1)
Basophils Relative: 0.7 % (ref 0.0–3.0)
Eosinophils Absolute: 0.1 10*3/uL (ref 0.0–0.7)
Eosinophils Relative: 1.5 % (ref 0.0–5.0)
HCT: 48 % (ref 39.0–52.0)
Hemoglobin: 16.1 g/dL (ref 13.0–17.0)
Lymphocytes Relative: 22 % (ref 12.0–46.0)
Lymphs Abs: 1.8 10*3/uL (ref 0.7–4.0)
MCHC: 33.6 g/dL (ref 30.0–36.0)
MCV: 89.3 fl (ref 78.0–100.0)
Monocytes Absolute: 0.6 10*3/uL (ref 0.1–1.0)
Monocytes Relative: 7.9 % (ref 3.0–12.0)
Neutro Abs: 5.6 10*3/uL (ref 1.4–7.7)
Neutrophils Relative %: 67.9 % (ref 43.0–77.0)
Platelets: 286 10*3/uL (ref 150.0–400.0)
RBC: 5.38 Mil/uL (ref 4.22–5.81)
RDW: 13.2 % (ref 11.5–15.5)
WBC: 8.3 10*3/uL (ref 4.0–10.5)

## 2020-09-26 LAB — LIPID PANEL
Cholesterol: 108 mg/dL (ref 0–200)
HDL: 36.4 mg/dL — ABNORMAL LOW (ref >39.00)
LDL Cholesterol: 55 mg/dL (ref 0–99)
NonHDL: 71.94
Total CHOL/HDL Ratio: 3
Triglycerides: 87 mg/dL (ref 0.0–149.0)
VLDL: 17.4 mg/dL (ref 0.0–40.0)

## 2020-09-26 LAB — PSA: PSA: 0.92 ng/mL (ref 0.10–4.00)

## 2020-09-26 LAB — SEDIMENTATION RATE: Sed Rate: 1 mm/hr (ref 0–20)

## 2020-09-26 LAB — MAGNESIUM: Magnesium: 2.1 mg/dL (ref 1.5–2.5)

## 2020-09-26 LAB — C-REACTIVE PROTEIN: CRP: <1 mg/dL (ref 0.5–20.0)

## 2020-09-26 NOTE — Patient Instructions (Addendum)
For you wellness exam today I have ordered cbc, cmp and  lipid panel.  Will get screening psa.  Hx of diabetes will add urine microalbumin.  Vaccine up to date except covid booster not done.  Recommend exercise and healthy diet.  We will let you know lab results as they come in.  Follow up date appointment will be determined after lab review.   For arthralgias will get inflammatory labs.   Preventive Care 80-54 Years Old, Male Preventive care refers to lifestyle choices and visits with your health care provider that can promote health and wellness. This includes:  A yearly physical exam. This is also called an annual wellness visit.  Regular dental and eye exams.  Immunizations.  Screening for certain conditions.  Healthy lifestyle choices, such as: ? Eating a healthy diet. ? Getting regular exercise. ? Not using drugs or products that contain nicotine and tobacco. ? Limiting alcohol use. What can I expect for my preventive care visit? Physical exam Your health care provider will check your:  Height and weight. These may be used to calculate your BMI (body mass index). BMI is a measurement that tells if you are at a healthy weight.  Heart rate and blood pressure.  Body temperature.  Skin for abnormal spots. Counseling Your health care provider may ask you questions about your:  Past medical problems.  Family's medical history.  Alcohol, tobacco, and drug use.  Emotional well-being.  Home life and relationship well-being.  Sexual activity.  Diet, exercise, and sleep habits.  Work and work Astronomer.  Access to firearms. What immunizations do I need? Vaccines are usually given at various ages, according to a schedule. Your health care provider will recommend vaccines for you based on your age, medical history, and lifestyle or other factors, such as travel or where you work.   What tests do I need? Blood tests  Lipid and cholesterol levels. These  may be checked every 5 years, or more often if you are over 65 years old.  Hepatitis C test.  Hepatitis B test. Screening  Lung cancer screening. You may have this screening every year starting at age 65 if you have a 30-pack-year history of smoking and currently smoke or have quit within the past 15 years.  Prostate cancer screening. Recommendations will vary depending on your family history and other risks.  Genital exam to check for testicular cancer or hernias.  Colorectal cancer screening. ? All adults should have this screening starting at age 41 and continuing until age 36. ? Your health care provider may recommend screening at age 19 if you are at increased risk. ? You will have tests every 1-10 years, depending on your results and the type of screening test.  Diabetes screening. ? This is done by checking your blood sugar (glucose) after you have not eaten for a while (fasting). ? You may have this done every 1-3 years.  STD (sexually transmitted disease) testing, if you are at risk. Follow these instructions at home: Eating and drinking  Eat a diet that includes fresh fruits and vegetables, whole grains, lean protein, and low-fat dairy products.  Take vitamin and mineral supplements as recommended by your health care provider.  Do not drink alcohol if your health care provider tells you not to drink.  If you drink alcohol: ? Limit how much you have to 0-2 drinks a day. ? Be aware of how much alcohol is in your drink. In the [place] one drink equals one 12  oz bottle of beer (355 mL), one 5 oz glass of wine (148 mL), or one 1 oz glass of hard liquor (44 mL).   Lifestyle  Take daily care of your teeth and gums. Brush your teeth every morning and night with fluoride toothpaste. Floss one time each day.  Stay active. Exercise for at least 30 minutes 5 or more days each week.  Do not use any products that contain nicotine or tobacco, such as cigarettes, e-cigarettes, and  chewing tobacco. If you need help quitting, ask your health care provider.  Do not use drugs.  If you are sexually active, practice safe sex. Use a condom or other form of protection to prevent STIs (sexually transmitted infections).  If told by your health care provider, take low-dose aspirin daily starting at age 32.  Find healthy ways to cope with stress, such as: ? Meditation, yoga, or listening to music. ? Journaling. ? Talking to a trusted person. ? Spending time with friends and family. Safety  Always wear your seat belt while driving or riding in a vehicle.  Do not drive: ? If you have been drinking alcohol. Do not ride with someone who has been drinking. ? When you are tired or distracted. ? While texting.  Wear a helmet and other protective equipment during sports activities.  If you have firearms in your house, make sure you follow all gun safety procedures. What's next?  Go to your health care provider once a year for an annual wellness visit.  Ask your health care provider how often you should have your eyes and teeth checked.  Stay up to date on all vaccines. This information is not intended to replace advice given to you by your health care provider. Make sure you discuss any questions you have with your health care provider. Document Revised: 04/04/2019 Document Reviewed: 06/30/2018 Elsevier Patient Education  2021 [employer].

## 2020-09-26 NOTE — Progress Notes (Signed)
Subjective:    Patient ID: Brian Pittman, male    DOB: 03/11/1966, 55 y.o.   MRN: [id number]  HPI  Pt in for follow up.  Pt did see Dr. Lonzo Cloud. Pt is on Farxiga and glipizide.  Since seeing her pt has been eating a lot less sugar.  Pt will go ahead and get wellness exam today. Pt is fasting.  Will get psa.  Pt had covid vaccine phizer. Also had flu vaccine. Pneumonia vaccine up to date.  Pt had colonoscopy 4 years ago. Was normal per pt. Repeat in 10 years.  Diabetic eye exam will be done October 24, 2020.   Review of Systems  Constitutional: Negative for chills, diaphoresis, fatigue and fever.  HENT: Negative for congestion, ear discharge, ear pain, nosebleeds, sinus pressure, sore throat and tinnitus.   Respiratory: Negative for cough, chest tightness, shortness of breath and wheezing.   Cardiovascular: Negative for chest pain and palpitations.  Gastrointestinal: Negative for abdominal pain, anal bleeding, constipation and nausea.  Musculoskeletal: Positive for arthralgias. Negative for back pain.       Mostly knees and hands. For years worse in the morning.   Skin: Negative for rash.  Psychiatric/Behavioral: Negative for behavioral problems, decreased concentration and sleep disturbance. The patient is not nervous/anxious.     Past Medical History:  Diagnosis Date  . Body mass index (BMI) 31.0-31.9, adult   . DM2 (diabetes mellitus, type 2) (HCC)   . Ear infection   . Generalized anxiety disorder 06/28/2018  . GERD (gastroesophageal reflux disease)   . Hypercholesterolemia   . Panic disorder (episodic paroxysmal anxiety) 09/14/2016  . Pneumonia   . Sleep apnea   . Trochanteric bursitis of right hip 05/17/2018     Social History   Socioeconomic History  . Marital status: Single    Spouse name: Not on file  . Number of children: Not on file  . Years of education: Not on file  . Highest education level: Not on file  Occupational History  . Occupation: Product manager: Production assistant, 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  Tobacco Use  . Smoking status: Former Games developer  . Smokeless tobacco: Former Neurosurgeon    Types: Snuff    Quit date: 10/2018  . Tobacco comment: quit over 20 years ago  Vaping Use  . Vaping Use: Never used  Substance and Sexual Activity  . Alcohol use: Yes    Comment: drinks alchohol on a regular basis, typically 1 beer daily  . Drug use: No  . Sexual activity: Not on file  Other Topics Concern  . Not on file  Social History Narrative  . Not on file   Social Determinants of Health   Financial Resource Strain: Not on file  Food Insecurity: Not on file  Transportation Needs: Not on file  Physical Activity: Not on file  Stress: Not on file  Social Connections: Not on file  Intimate Partner Violence: Not on file    Past Surgical History:  Procedure Laterality Date  . CHOLECYSTECTOMY    . COLONOSCOPY     Dr Rayfield Citizen around 2016 said it was normal    Family History  Problem Relation Age of Onset  . Diabetes Mother   . Diabetes Father   . Leukemia Father   . Skin cancer Father   . Heart attack Father   . Atrial fibrillation Father   . Lung cancer Maternal Grandfather   . Heart attack Paternal Grandmother   . Leukemia Paternal Grandmother   .  Stroke Paternal Grandfather   . Leukemia Paternal Grandfather   . GER disease Sister   . Arthritis Sister   . Colon cancer Neg Hx   . Esophageal cancer Neg Hx   . Stomach cancer Neg Hx     Allergies  Allergen Reactions  . Jardiance [Empagliflozin]   . Metformin Diarrhea  . Ozempic (0.25 Or 0.5 Mg-Dose) [Semaglutide(0.25 Or 0.5mg -Dos)] Nausea Only  . Penicillins Hives and Itching  . Prednisone     Elevates blood surgar, increased heart rate  . Ertugliflozin Rash  . Lovastatin Rash    Current Outpatient Medications on File Prior to Visit  Medication Sig Dispense Refill  . azelastine (ASTELIN) 0.1 % nasal spray Place 2 sprays into both nostrils 2 (two) times daily. Use in each  nostril as directed 30 mL 1  . dapagliflozin propanediol (FARXIGA) 10 MG TABS tablet Take 1 tablet (10 mg total) by mouth daily. 30 tablet 6  . glipiZIDE (GLUCOTROL) 5 MG tablet Take 1.5 tablets (7.5 mg total) by mouth 2 (two) times daily before a meal. 270 tablet 3  . levocetirizine (XYZAL) 5 MG tablet Take 1 tablet (5 mg total) by mouth every evening. Can give 90 tab rx. 30 tablet 11  . omeprazole (PRILOSEC) 40 MG capsule Take 1 capsule (40 mg total) by mouth daily. 90 capsule 3  . rosuvastatin (CRESTOR) 10 MG tablet TAKE 1 TABLET BY MOUTH DAILY 90 tablet 0  . sodium polystyrene (KAYEXALATE) 15 GM/60ML suspension 60 ml twice daily for 3 days 360 mL 0   No current facility-administered medications on file prior to visit.    BP (!) 119/59   Pulse 61   Resp 18   Ht 5\' 10"  (1.778 m)   Wt 205 lb 9.6 oz (93.3 kg)   SpO2 100%   BMI 29.50 kg/m       Objective:   Physical Exam  General Mental Status- Alert. General Appearance- Not in acute distress.   Skin General: Color- Normal Color. Moisture- Normal Moisture.  Neck Carotid Arteries- Normal color. Moisture- Normal Moisture. No carotid bruits. No JVD.  Chest and Lung Exam Auscultation: Breath Sounds:-Normal.  Cardiovascular Auscultation:Rythm- Regular. Murmurs & Other Heart Sounds:Auscultation of the heart reveals- No Murmurs.  Abdomen Inspection:-Inspeection Normal. Palpation/Percussion:Note:No mass. Palpation and Percussion of the abdomen reveal- Non Tender, Non Distended + BS, no rebound or guarding.    Neurologic Cranial Nerve exam:- CN III-XII intact(No nystagmus), symmetric smile. Finger to Nose:- Normal/Intact Strength:- 5/5 equal and symmetric strength both upper and lower extremities.  Hands- on both sides pip joints appear thick.   Knees- mild crepitus on both knees flexion and extension.      Assessment & Plan:  For you wellness exam today I have ordered cbc, cmp and  lipid panel.  Will get screening  psa.  Hx of diabetes will add urine microalbumin.  Vaccine up to date except covid booster not done.  Recommend exercise and healthy diet.  We will let you know lab results as they come in.  Follow up date appointment will be determined after lab review.   For arthralgias will get inflammatory labs.  charge in addition to wellness exam. Addressed arthralgia and muscle cramps.

## 2020-09-28 LAB — RHEUMATOID FACTOR: Rheumatoid fact SerPl-aCnc: <14 IU/mL (ref <14)

## 2020-09-28 LAB — HLA-B27 ANTIGEN: HLA-B27 Antigen: NEGATIVE

## 2020-09-28 LAB — MICROALBUMIN, URINE: Microalb, Ur: 0.6 mg/dL

## 2020-09-28 LAB — ANA: Anti Nuclear Antibody (ANA): NEGATIVE

## 2020-10-24 DIAGNOSIS — E119 Type 2 diabetes mellitus without complications: Secondary | ICD-10-CM | POA: Diagnosis not present

## 2020-10-24 DIAGNOSIS — H11153 Pinguecula, bilateral: Secondary | ICD-10-CM | POA: Diagnosis not present

## 2020-12-27 ENCOUNTER — Ambulatory Visit: Payer: BC Managed Care – PPO | Admitting: Medical

## 2021-01-01 DIAGNOSIS — D485 Neoplasm of uncertain behavior of skin: Secondary | ICD-10-CM | POA: Diagnosis not present

## 2021-01-01 DIAGNOSIS — C44391 Other specified malignant neoplasm of skin of nose: Secondary | ICD-10-CM | POA: Diagnosis not present

## 2021-01-01 DIAGNOSIS — D2239 Melanocytic nevi of other parts of face: Secondary | ICD-10-CM | POA: Diagnosis not present

## 2021-01-06 ENCOUNTER — Other Ambulatory Visit: Payer: Self-pay | Admitting: Medical

## 2021-01-14 ENCOUNTER — Ambulatory Visit: Payer: BC Managed Care – PPO | Admitting: Internal Medicine

## 2021-02-13 ENCOUNTER — Telehealth: Payer: Self-pay | Admitting: Medical

## 2021-02-13 MED ORDER — OMEPRAZOLE 40 MG PO CPDR: 40.0000 mg | DELAYED_RELEASE_CAPSULE | Freq: Every day | ORAL | 1 refills | Status: DC

## 2021-02-13 NOTE — Telephone Encounter (Signed)
Pt states he is needing a stronger dosage for his acid reflux, states he and Ramon Dredge has talked about it previously. Pt states prilosec works good for a while but not strong enough and he really struggle at night. Pt would like a call back

## 2021-02-13 NOTE — Addendum Note (Signed)
Addended by: Gwenevere Abbot on: 02/13/2021 10:06 PM   Modules accepted: Orders

## 2021-02-14 NOTE — Telephone Encounter (Signed)
Didn't call pt to follow up in Oct since he already has an upcoming appointment on 02/24/21

## 2021-02-24 ENCOUNTER — Ambulatory Visit: Payer: BC Managed Care – PPO | Admitting: Medical

## 2021-03-11 ENCOUNTER — Ambulatory Visit: Payer: BC Managed Care – PPO | Admitting: Internal Medicine

## 2021-03-17 ENCOUNTER — Ambulatory Visit (INDEPENDENT_AMBULATORY_CARE_PROVIDER_SITE_OTHER): Payer: BC Managed Care – PPO | Admitting: Medical

## 2021-03-17 ENCOUNTER — Ambulatory Visit (HOSPITAL_BASED_OUTPATIENT_CLINIC_OR_DEPARTMENT_OTHER)
Admission: RE | Admit: 2021-03-17 | Discharge: 2021-03-17 | Disposition: A | Discharge status: Home / Self Care | Payer: BC Managed Care – PPO | Source: Ambulatory Visit | Attending: Medical | Admitting: Medical

## 2021-03-17 ENCOUNTER — Other Ambulatory Visit: Payer: Self-pay

## 2021-03-17 VITALS — BP 124/70 | HR 55 | Temp 98.0°F | Resp 18 | Ht 70.0 in | Wt 202.0 lb

## 2021-03-17 DIAGNOSIS — E875 Hyperkalemia: Secondary | ICD-10-CM

## 2021-03-17 DIAGNOSIS — R252 Cramp and spasm: Secondary | ICD-10-CM | POA: Diagnosis not present

## 2021-03-17 DIAGNOSIS — M25551 Pain in right hip: Secondary | ICD-10-CM

## 2021-03-17 DIAGNOSIS — I8001 Phlebitis and thrombophlebitis of superficial vessels of right lower extremity: Secondary | ICD-10-CM | POA: Diagnosis not present

## 2021-03-17 DIAGNOSIS — M79609 Pain in unspecified limb: Secondary | ICD-10-CM

## 2021-03-17 DIAGNOSIS — M79651 Pain in right thigh: Secondary | ICD-10-CM

## 2021-03-17 LAB — COMPREHENSIVE METABOLIC PANEL
ALT: 32 U/L (ref 0–53)
AST: 21 U/L (ref 0–37)
Albumin: 4.5 g/dL (ref 3.5–5.2)
Alkaline Phosphatase: 65 U/L (ref 39–117)
BUN: 14 mg/dL (ref 6–23)
CO2: 28 mEq/L (ref 19–32)
Calcium: 9.8 mg/dL (ref 8.4–10.5)
Chloride: 102 mEq/L (ref 96–112)
Creatinine, Ser: 0.72 mg/dL (ref 0.40–1.50)
GFR: 103.66 mL/min (ref >60.00)
Glucose, Bld: 138 mg/dL — ABNORMAL HIGH (ref 70–99)
Potassium: 5 mEq/L (ref 3.5–5.1)
Sodium: 139 mEq/L (ref 135–145)
Total Bilirubin: 0.7 mg/dL (ref 0.2–1.2)
Total Protein: 6.7 g/dL (ref 6.0–8.3)

## 2021-03-17 LAB — MAGNESIUM: Magnesium: 1.9 mg/dL (ref 1.5–2.5)

## 2021-03-17 NOTE — Progress Notes (Signed)
Subjective:    Patient ID: Brian Pittman, male    DOB: 28-Jan-1966, 55 y.o.   MRN: [id number]  HPI  Pt in for rt leg pain. Pt states when he sites down will have some pain from rt hip down to laterate knee area. Also some pain behind knee a down to lateral calf. Pain states often notices pain more sitting down operating tractor/heavy. He states runs skid steer at times and notices pain. Pain presently more behind knee. Pain has been present for about one month. If takes alleve will help but only occasionally. Last took alleve to help him sleep.   On skid steer for 8 hours. He tries to stop and take a break every 8 hours.  On and off muscle cramps. Hx of elevated cholesterol.  Pt has elevated sugar. Last a1c 8.1. Pt is seeing Dr. Lonzo Cloud.      Review of Systems  Constitutional:  Negative for chills, fatigue and fever.  HENT:  Negative for dental problem.   Respiratory:  Negative for cough, chest tightness, shortness of breath and wheezing.   Cardiovascular:  Negative for chest pain and palpitations.  Gastrointestinal:  Negative for abdominal pain.  Musculoskeletal:  Negative for back pain, gait problem, joint swelling and myalgias.       See hpi  Neurological:  Negative for dizziness, numbness and headaches.  Hematological:  Negative for adenopathy. Does not bruise/bleed easily.  Psychiatric/Behavioral:  Negative for behavioral problems and confusion.     Past Medical History:  Diagnosis Date   Body mass index (BMI) 31.0-31.9, adult    DM2 (diabetes mellitus, type 2) (HCC)    Ear infection    Generalized anxiety disorder 06/28/2018   GERD (gastroesophageal reflux disease)    Hypercholesterolemia    Panic disorder (episodic paroxysmal anxiety) 09/14/2016   Pneumonia    Sleep apnea    Trochanteric bursitis of right hip 05/17/2018     Social History   Socioeconomic History   Marital status: Single    Spouse name: Not on file   Number of children: Not on file   Years  of education: Not on file   Highest education level: Not on file  Occupational History   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, 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  Tobacco Use   Smoking status: Former   Smokeless tobacco: Former    Types: Snuff    Quit date: 10/2018   Tobacco comments:    quit over 20 years ago  Vaping Use   Vaping Use: Never used  Substance and Sexual Activity   Alcohol use: Yes    Comment: drinks alchohol on a regular basis, typically 1 beer daily   Drug use: No   Sexual activity: Not on file  Other Topics Concern   Not on file  Social History Narrative   Not on file   Social Determinants of Health   Financial Resource Strain: Not on file  Food Insecurity: Not on file  Transportation Needs: Not on file  Physical Activity: Not on file  Stress: Not on file  Social Connections: Not on file  Intimate Partner Violence: Not on file    Past Surgical History:  Procedure Laterality Date   CHOLECYSTECTOMY     COLONOSCOPY     Dr Rayfield Citizen around 2016 said it was normal    Family History  Problem Relation Age of Onset   Diabetes Mother    Diabetes Father    Leukemia Father    Skin cancer  Father    Heart attack Father    Atrial fibrillation Father    Lung cancer Maternal Grandfather    Heart attack Paternal Grandmother    Leukemia Paternal Grandmother    Stroke Paternal Grandfather    Leukemia Paternal Grandfather    GER disease Sister    Arthritis Sister    Colon cancer Neg Hx    Esophageal cancer Neg Hx    Stomach cancer Neg Hx     Allergies  Allergen Reactions   Jardiance [Empagliflozin]    Metformin Diarrhea   Ozempic (0.25 Or 0.5 Mg-Dose) [Semaglutide(0.25 Or 0.5mg -Dos)] Nausea Only   Penicillins Hives and Itching   Prednisone     Elevates blood surgar, increased heart rate   Ertugliflozin Rash   Lovastatin Rash    Current Outpatient Medications on File Prior to Visit  Medication Sig Dispense Refill   dapagliflozin propanediol (FARXIGA) 10  MG TABS tablet Take 1 tablet (10 mg total) by mouth daily. 30 tablet 6   glipiZIDE (GLUCOTROL) 5 MG tablet Take 1.5 tablets (7.5 mg total) by mouth 2 (two) times daily before a meal. 270 tablet 3   omeprazole (PRILOSEC) 40 MG capsule Take 1 capsule (40 mg total) by mouth daily. 30 capsule 1   rosuvastatin (CRESTOR) 10 MG tablet TAKE 1 TABLET BY MOUTH DAILY 90 tablet 0   sodium polystyrene (KAYEXALATE) 15 GM/60ML suspension 60 ml twice daily for 3 days 360 mL 0   No current facility-administered medications on file prior to visit.    BP 124/70 (BP Location: Left Arm, Patient Position: Sitting, Cuff Size: Normal)   Pulse (!) 55   Temp 98 F (36.7 C) (Oral)   Resp 18   Ht 5\' 10"  (1.778 m)   Wt 202 lb (91.6 kg)   SpO2 98%   BMI 28.98 kg/m       Objective:   Physical Exam  General- No acute distress. Pleasant patient. Neck- Full range of motion, no jvd Lungs- Clear, even and unlabored. Heart- regular rate and rhythm. Neurologic- CNII- XII grossly intact.  Rt knee- prominent tibial tuberosity. No instabitlity.  Rt popliteal area mild tenderness directly on palpation. Negative homans signs.(Rt calf is not swollen) Rt hip- mild tender to palpation. Pain on palpatoin of lateral thigh all the way down to the lateral knee.        Assessment & Plan:   For rt popliteal area pain will get rt lower ext . Asking for study to be done today and stat. Differential dx discussed. R/O DVT. See baker cyst present.  For rt side lateral hip down to knee pain considering soft tissue pain. For this thinks best to refer to sports medicine. Will see if they would do [place] or other imaging.   For leg cramps get cmp and magnesium level.  Follow up date to be determined after [place] review and after sport med office note review. Or follow up as needed.  For pain recommend using low does tylenol with lower dose ibuprofen(or low dose alleve).  [place], PA-C   Time spent was  31 minutes which  consisted of chart review, discussing diagnosis, work up ,treatment and documentation.

## 2021-03-17 NOTE — Progress Notes (Signed)
Name: Brian Pittman  Age/ Sex: 55 y.o., male   MRN/ DOB: [id number], Sep 25, 1965     PCP: [name]   Reason for Endocrinology Evaluation: Type 2 Diabetes Mellitus  Initial Endocrine Consultative Visit: 02/14/2020    PATIENT IDENTIFIER: [name] is a 55 y.o. male with a past medical history of T2DM and Dyslipidemia. The patient has followed with Endocrinology clinic since 02/14/2020 for consultative assistance with management of his diabetes.  DIABETIC HISTORY:  [name] was diagnosed with DM in 2015,metformin - GI side effects . Ozempic/Trulicity - sour stomach and gas . His hemoglobin A1c has ranged from 7.1% in 08/2019, peaking at 7.5% in 03/2020.  On his initial to our clinic he had an A1c of 7.1 %. He was on Amaryl and Qtern. We switched Glimepiride to Glipizide and continued Qtern but by 07/2020 [name] became cost prohibitve and started [place]   SUBJECTIVE:   During the last visit (09/10/2020): A1c 8.1 % We increased  Glipizide and continued Qtern     Today (03/18/2021): Brian Pittman is here for a follow up on diabetes.  He checks his blood sugars 2 times daily. The patient has not had hypoglycemic episodes since the last clinic visit.    Has occasional burning and feeling of pin needles in the morning   Was seen yesterday by PCP for posterior right knee pain for the past month, radiates down to the calves   HOME DIABETES REGIMEN:  Glipizide 5 mg 1.5 tabs BID  Farxiga 10 mg daily      Statin: Yes ACE-I/ARB: no   GLUCOSE LOG:  Fasting 119- 172 mg/dL  PM 956 - 387 mg/dL     DIABETIC COMPLICATIONS: Microvascular complications:   Denies: CKD, neuropathy, retinopathy  Last Eye Exam: Completed 2022 Dr. Hazle Quant   Macrovascular complications:   Denies: CAD, CVA, PVD   HISTORY:  Past Medical History:  Past Medical History:  Diagnosis Date   Body mass index (BMI) 31.0-31.9, adult    DM2 (diabetes mellitus, type 2) (HCC)    Ear infection     Generalized anxiety disorder 06/28/2018   GERD (gastroesophageal reflux disease)    Hypercholesterolemia    Panic disorder (episodic paroxysmal anxiety) 09/14/2016   Pneumonia    Sleep apnea    Trochanteric bursitis of right hip 05/17/2018   Past Surgical History:  Past Surgical History:  Procedure Laterality Date   CHOLECYSTECTOMY     COLONOSCOPY     Dr Rayfield Citizen around 2016 said it was normal   Social History:  reports that he has quit smoking. He quit smokeless tobacco use about 2 years ago.  His smokeless tobacco use included snuff. He reports current alcohol use. He reports that he does not use drugs. Family History:  Family History  Problem Relation Age of Onset   Diabetes Mother    Diabetes Father    Leukemia Father    Skin cancer Father    Heart attack Father    Atrial fibrillation Father    Lung cancer Maternal Grandfather    Heart attack Paternal Grandmother    Leukemia Paternal Grandmother    Stroke Paternal Grandfather    Leukemia Paternal Grandfather    GER disease Sister    Arthritis Sister    Colon cancer Neg Hx    Esophageal cancer Neg Hx    Stomach cancer Neg Hx      HOME MEDICATIONS: Allergies as of 03/18/2021       Reactions   Jardiance [  empagliflozin]    Metformin Diarrhea   Ozempic (0.25 Or 0.5 Mg-dose) [semaglutide(0.25 Or 0.5mg -dos)] Nausea Only   Penicillins Hives, Itching   Prednisone    Elevates blood surgar, increased heart rate   Ertugliflozin Rash   Lovastatin Rash        Medication List        Accurate as of March 18, 2021  7:33 AM. If you have any questions, ask your nurse or doctor.          dapagliflozin propanediol 10 MG Tabs tablet Commonly known as: Farxiga Take 1 tablet (10 mg total) by mouth daily.   glipiZIDE 5 MG tablet Commonly known as: GLUCOTROL Take 1.5 tablets (7.5 mg total) by mouth 2 (two) times daily before a meal.   omeprazole 40 MG capsule Commonly known as: PRILOSEC Take 1 capsule (40 mg total)  by mouth daily.   rosuvastatin 10 MG tablet Commonly known as: CRESTOR TAKE 1 TABLET BY MOUTH DAILY   sodium polystyrene 15 GM/60ML suspension Commonly known as: KAYEXALATE 60 ml twice daily for 3 days         OBJECTIVE:   Vital Signs: BP 136/82 (BP Location: Left Arm, Patient Position: Sitting, Cuff Size: Small)   Pulse 86   Ht 5\' 10"  (1.778 m)   Wt 201 lb 6.4 oz (91.4 kg)   SpO2 99%   BMI 28.90 kg/m   Wt Readings from Last 3 Encounters:  03/18/21 201 lb 6.4 oz (91.4 kg)  03/17/21 202 lb (91.6 kg)  09/26/20 205 lb 9.6 oz (93.3 kg)     Exam: General: Pt appears well and is in NAD  Lungs: Clear with good BS bilat with no rales, rhonchi, or wheezes  Heart: RRR   Abdomen: Normoactive bowel sounds, soft, nontender, without masses or organomegaly palpable  Extremities: No pretibial edema.   Neuro: MS is good with appropriate affect, pt is alert and Ox3     DM foot exam: 03/18/2021   The skin of the feet is intact without sores or ulcerations. The pedal pulses are 1+ on right and 1 + on left. The sensation is intact to a screening 5.07, 10 gram monofilament bilaterally        DATA REVIEWED:  Lab Results  Component Value Date   HGBA1C 7.4 (A) 03/18/2021   HGBA1C 8.1 (A) 09/10/2020   HGBA1C 7.4 (H) 06/28/2020   Lab Results  Component Value Date   MICROALBUR 0.6 09/26/2020   LDLCALC 55 09/26/2020   CREATININE 0.72 03/17/2021    Lab Results  Component Value Date   CHOL 108 09/26/2020   HDL 36.40 (L) 09/26/2020   LDLCALC 55 09/26/2020   TRIG 87.0 09/26/2020   CHOLHDL 3 09/26/2020         ASSESSMENT / PLAN / RECOMMENDATIONS:   1) Type 2 Diabetes Mellitus, Poorly controlled, Without complications - Most recent A1c of 7.4  %. Goal A1c < 7.0 %.     - Praised the pt on Improved glucose control  - Intolerant to Metformin and GLP-1 agonists  - He has been noted with higher BG readings at supper time, will increase his morning Glipizide.  - He was  provided with another 11/26/2020 Coupon       MEDICATIONS: -  Increase  Glipizide 5 mg, 2 tablets before Breakfast and continue 1.5 tablets before supper  - Continue Farxiga 10 mg daily    EDUCATION / INSTRUCTIONS: BG monitoring instructions: Patient is instructed to check his blood sugars  1 times a day Call [facility] if: BG persistently < 70  I reviewed the Rule of 15 for the treatment of hypoglycemia in detail with the patient. Literature supplied.    2) Diabetic complications:  Eye: Does not have known diabetic retinopathy.  Neuro/ Feet: Does not have known diabetic peripheral neuropathy .  Renal: Patient does not have known baseline CKD. He   Is not  on an ACEI/ARB at present.      F/U in 4 months    Signed electronically by: Lyndle Herrlich, MD  [facility]  [facility] [address] [place] [place], [place] [address] Phone: [phone] FAX: [phone]   CC: [name] [address] [place] [address] Phone: [phone]  Fax: [phone]  Return to Endocrinology clinic as below: Future Appointments  Date Time Provider Department Center  03/28/2021  8:10 AM Myra Rude, MD [facility]

## 2021-03-17 NOTE — Patient Instructions (Signed)
For rt popliteal area pain will get rt lower ext [place]. Asking for study to be done today and stat. Differential dx discussed. R/O DVT. See baker cyst present.  For rt side lateral hip down to knee pain considering soft tissue pain. For this thinks best to refer to sports medicine. Will see if they would do [place] or other imaging.   For leg cramps get cmp and magnesium level.  Follow up date to be determined after [place] review and after sport med office note review. Or follow up as needed.  For pain recommend using low does tylenol with lower dose ibuprofen(or low dose alleve).

## 2021-03-18 ENCOUNTER — Ambulatory Visit (INDEPENDENT_AMBULATORY_CARE_PROVIDER_SITE_OTHER): Payer: BC Managed Care – PPO | Admitting: Internal Medicine

## 2021-03-18 ENCOUNTER — Encounter: Payer: Self-pay | Admitting: Internal Medicine

## 2021-03-18 VITALS — BP 136/82 | HR 86 | Ht 70.0 in | Wt 201.4 lb

## 2021-03-18 DIAGNOSIS — E1165 Type 2 diabetes mellitus with hyperglycemia: Secondary | ICD-10-CM

## 2021-03-18 LAB — POCT GLYCOSYLATED HEMOGLOBIN (HGB A1C): Hemoglobin A1C: 7.4 % — AB (ref 4.0–5.6)

## 2021-03-18 MED ORDER — GLIPIZIDE 5 MG PO TABS: ORAL_TABLET | ORAL | 3 refills | Status: DC

## 2021-03-18 NOTE — Patient Instructions (Addendum)
-   Keep up the Good Work ! - Increase  Glipizide 5 mg, 2 tablets before Breakfast and 1.5 tablets before supper  - Continue Farxiga 10 mg 1 tablet with breakfast      HOW TO TREAT LOW BLOOD SUGARS (Blood sugar LESS THAN 70 MG/DL) Please follow the RULE OF 15 for the treatment of hypoglycemia treatment (when your (blood sugars are less than 70 mg/dL)   STEP 1: Take 15 grams of carbohydrates when your blood sugar is low, which includes:  3-4 GLUCOSE TABS  OR 3-4 OZ OF JUICE OR REGULAR SODA OR ONE TUBE OF GLUCOSE GEL    STEP 2: RECHECK blood sugar in 15 MINUTES STEP 3: If your blood sugar is still low at the 15 minute recheck --> then, go back to STEP 1 and treat AGAIN with another 15 grams of carbohydrates.

## 2021-03-28 ENCOUNTER — Ambulatory Visit: Payer: BC Managed Care – PPO | Admitting: Family Medicine

## 2021-04-05 ENCOUNTER — Other Ambulatory Visit: Payer: Self-pay | Admitting: Medical

## 2021-04-28 ENCOUNTER — Ambulatory Visit: Payer: BC Managed Care – PPO | Admitting: Family Medicine

## 2021-04-28 NOTE — Progress Notes (Deleted)
  Brian Pittman - 55 y.o. male MRN [id number]  Date of birth: 05-Dec-1965  SUBJECTIVE:  Including CC & ROS.  No chief complaint on file.   Brian Pittman is a 55 y.o. male that is  ***.  ***   Review of Systems See HPI   HISTORY: Past Medical, Surgical, Social, and Family History Reviewed & Updated per EMR.   Pertinent Historical Findings include:  Past Medical History:  Diagnosis Date   Body mass index (BMI) 31.0-31.9, adult    DM2 (diabetes mellitus, type 2) (HCC)    Ear infection    Generalized anxiety disorder 06/28/2018   GERD (gastroesophageal reflux disease)    Hypercholesterolemia    Panic disorder (episodic paroxysmal anxiety) 09/14/2016   Pneumonia    Sleep apnea    Trochanteric bursitis of right hip 05/17/2018    Past Surgical History:  Procedure Laterality Date   CHOLECYSTECTOMY     COLONOSCOPY     Dr Rayfield Citizen around 2016 said it was normal    Family History  Problem Relation Age of Onset   Diabetes Mother    Diabetes Father    Leukemia Father    Skin cancer Father    Heart attack Father    Atrial fibrillation Father    Lung cancer Maternal Grandfather    Heart attack Paternal Grandmother    Leukemia Paternal Grandmother    Stroke Paternal Grandfather    Leukemia Paternal Grandfather    GER disease Sister    Arthritis Sister    Colon cancer Neg Hx    Esophageal cancer Neg Hx    Stomach cancer Neg Hx     Social History   Socioeconomic History   Marital status: Single    Spouse name: Not on file   Number of children: Not on file   Years of education: Not on file   Highest education level: Not on file  Occupational History   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, 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  Tobacco Use   Smoking status: Former   Smokeless tobacco: Former    Types: Snuff    Quit date: 10/2018   Tobacco comments:    quit over 20 years ago  Vaping Use   Vaping Use: Never used  Substance and Sexual Activity   Alcohol use: Yes    Comment:  drinks alchohol on a regular basis, typically 1 beer daily   Drug use: No   Sexual activity: Not on file  Other Topics Concern   Not on file  Social History Narrative   Not on file   Social Determinants of Health   Financial Resource Strain: Not on file  Food Insecurity: Not on file  Transportation Needs: Not on file  Physical Activity: Not on file  Stress: Not on file  Social Connections: Not on file  Intimate Partner Violence: Not on file     PHYSICAL EXAM:  VS: There were no vitals taken for this visit. Physical Exam Gen: NAD, alert, cooperative with exam, well-appearing MSK:  ***      ASSESSMENT & PLAN:   No problem-specific Assessment & Plan notes found for this encounter.

## 2021-05-21 ENCOUNTER — Encounter: Payer: Self-pay | Admitting: Family Medicine

## 2021-05-21 ENCOUNTER — Ambulatory Visit: Payer: Self-pay

## 2021-05-21 ENCOUNTER — Ambulatory Visit (HOSPITAL_BASED_OUTPATIENT_CLINIC_OR_DEPARTMENT_OTHER)
Admission: RE | Admit: 2021-05-21 | Discharge: 2021-05-21 | Disposition: A | Discharge status: Home / Self Care | Payer: BC Managed Care – PPO | Source: Ambulatory Visit | Attending: Family Medicine | Admitting: Family Medicine

## 2021-05-21 ENCOUNTER — Other Ambulatory Visit: Payer: Self-pay

## 2021-05-21 ENCOUNTER — Ambulatory Visit (INDEPENDENT_AMBULATORY_CARE_PROVIDER_SITE_OTHER): Payer: BC Managed Care – PPO | Admitting: Family Medicine

## 2021-05-21 VITALS — BP 128/82 | Ht 70.0 in | Wt 201.0 lb

## 2021-05-21 DIAGNOSIS — M25512 Pain in left shoulder: Secondary | ICD-10-CM

## 2021-05-21 DIAGNOSIS — M5412 Radiculopathy, cervical region: Secondary | ICD-10-CM | POA: Diagnosis not present

## 2021-05-21 DIAGNOSIS — M542 Cervicalgia: Secondary | ICD-10-CM | POA: Diagnosis not present

## 2021-05-21 MED ORDER — MELOXICAM 7.5 MG PO TABS: 7.5000 mg | ORAL_TABLET | Freq: Two times a day (BID) | ORAL | 1 refills | Status: AC | PRN

## 2021-05-21 NOTE — Assessment & Plan Note (Signed)
Symptoms seem more consistent with radicular pain as opposed to being isolated to the shoulder. -Counseled on home exercise therapy and supportive care. -Mobic. -X-ray. -Could consider physical therapy

## 2021-05-21 NOTE — Patient Instructions (Signed)
Nice to meet you Please try heat  Please try the mobic for 5 days and then as needed. Please do not take with ibuprofen  Please send me a message in MyChart with any questions or updates.  Please see me back in 3 weeks.   --Dr. Jordan Likes

## 2021-05-21 NOTE — Progress Notes (Signed)
Brian Pittman - 55 y.o. male MRN [id number]  Date of birth: 04/21/1966  SUBJECTIVE:  Including CC & ROS.  No chief complaint on file.   Brian Pittman is a 55 y.o. male that is presenting with left-sided arm and shoulder pain.  The pain has been ongoing for a few weeks.  No improvement and seems to be worsening.  Cannot get into a good positions.   Review of Systems See HPI   HISTORY: Past Medical, Surgical, Social, and Family History Reviewed & Updated per EMR.   Pertinent Historical Findings include:  Past Medical History:  Diagnosis Date   Body mass index (BMI) 31.0-31.9, adult    DM2 (diabetes mellitus, type 2) (HCC)    Ear infection    Generalized anxiety disorder 06/28/2018   GERD (gastroesophageal reflux disease)    Hypercholesterolemia    Panic disorder (episodic paroxysmal anxiety) 09/14/2016   Pneumonia    Sleep apnea    Trochanteric bursitis of right hip 05/17/2018    Past Surgical History:  Procedure Laterality Date   CHOLECYSTECTOMY     COLONOSCOPY     Dr Rayfield Citizen around 2016 said it was normal    Family History  Problem Relation Age of Onset   Diabetes Mother    Diabetes Father    Leukemia Father    Skin cancer Father    Heart attack Father    Atrial fibrillation Father    Lung cancer Maternal Grandfather    Heart attack Paternal Grandmother    Leukemia Paternal Grandmother    Stroke Paternal Grandfather    Leukemia Paternal Grandfather    GER disease Sister    Arthritis Sister    Colon cancer Neg Hx    Esophageal cancer Neg Hx    Stomach cancer Neg Hx     Social History   Socioeconomic History   Marital status: Single    Spouse name: Not on file   Number of children: Not on file   Years of education: Not on file   Highest education level: Not on file  Occupational History   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, 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  Tobacco Use   Smoking status: Former   Smokeless tobacco: Former    Types: Snuff    Quit date:  10/2018   Tobacco comments:    quit over 20 years ago  Vaping Use   Vaping Use: Never used  Substance and Sexual Activity   Alcohol use: Yes    Comment: drinks alchohol on a regular basis, typically 1 beer daily   Drug use: No   Sexual activity: Not on file  Other Topics Concern   Not on file  Social History Narrative   Not on file   Social Determinants of Health   Financial Resource Strain: Not on file  Food Insecurity: Not on file  Transportation Needs: Not on file  Physical Activity: Not on file  Stress: Not on file  Social Connections: Not on file  Intimate Partner Violence: Not on file     PHYSICAL EXAM:  VS: BP 128/82 (BP Location: Left Arm, Patient Position: Sitting)   Ht 5\' 10"  (1.778 m)   Wt 201 lb (91.2 kg)   BMI 28.84 kg/m  Physical Exam Gen: NAD, alert, cooperative with exam, well-appearing   Limited ultrasound: Left shoulder:  Normal-appearing biceps tendon. Normal-appearing subscapularis dynamic and static testing. Normal-appearing supraspinatus. Mild degenerative changes of the [facility] joint. No significant changes of the glenohumeral joint posteriorly  Summary:  No specific change as a source of his pain.  Ultrasound and interpretation by Clare Gandy, MD     ASSESSMENT & PLAN:   Cervical radiculopathy Symptoms seem more consistent with radicular pain as opposed to being isolated to the shoulder. -Counseled on home exercise therapy and supportive care. -Mobic. -X-ray. -Could consider physical therapy

## 2021-05-22 ENCOUNTER — Telehealth: Payer: Self-pay | Admitting: Family Medicine

## 2021-05-22 NOTE — Telephone Encounter (Signed)
Informed of results.   Myra Rude, MD [facility] 05/22/2021, 8:17 AM

## 2021-05-26 ENCOUNTER — Ambulatory Visit: Payer: BC Managed Care – PPO | Admitting: Family Medicine

## 2021-06-11 ENCOUNTER — Ambulatory Visit: Payer: BC Managed Care – PPO | Admitting: Family Medicine

## 2021-06-26 ENCOUNTER — Ambulatory Visit: Payer: BC Managed Care – PPO | Admitting: Family Medicine

## 2021-07-07 ENCOUNTER — Telehealth: Payer: Self-pay | Admitting: Internal Medicine

## 2021-07-07 NOTE — Telephone Encounter (Signed)
Pt stated he is having a problem with refill of glipiZIDE (GLUCOTROL) 5 MG tablet  he stated he was advised to up his dosage to two 1.5 pills in the am and one 1.5 in the pm. Since he has upped his dosage he has run out of pills and pharmacy will not fill them until next week, and he is out of them tomorrow. Please advise.

## 2021-07-08 ENCOUNTER — Ambulatory Visit: Payer: BC Managed Care – PPO | Admitting: Internal Medicine

## 2021-07-08 MED ORDER — GLIPIZIDE 5 MG PO TABS: ORAL_TABLET | ORAL | 3 refills | Status: DC

## 2021-07-08 NOTE — Telephone Encounter (Signed)
Sent updated script to pharmacy. Patient had the old script for 1.5 2 times a day .

## 2021-07-22 ENCOUNTER — Ambulatory Visit: Payer: BC Managed Care – PPO | Admitting: Internal Medicine

## 2021-07-24 ENCOUNTER — Other Ambulatory Visit: Payer: Self-pay | Admitting: Medical

## 2021-09-05 ENCOUNTER — Other Ambulatory Visit: Payer: Self-pay | Admitting: Internal Medicine

## 2021-10-07 ENCOUNTER — Other Ambulatory Visit: Payer: Self-pay | Admitting: Medical

## 2021-10-13 ENCOUNTER — Telehealth: Payer: Self-pay | Admitting: Medical

## 2021-10-13 MED ORDER — DAPAGLIFLOZIN PROPANEDIOL 10 MG PO TABS: 10.0000 mg | ORAL_TABLET | Freq: Every day | ORAL | 0 refills | Status: DC

## 2021-10-13 NOTE — Telephone Encounter (Signed)
30 day supply sent patient needs follow up appt  ?

## 2021-10-13 NOTE — Telephone Encounter (Signed)
Medication: FARXIGA 10 MG TABS tablet  ? ?Has the patient contacted their pharmacy? Yes.   ? ?Preferred Pharmacy (with phone number or street name): [facility] [place], [place] - [address]  ?[address] [place], [place] [address]  ?Phone:  [phone]  Fax:  [phone]  ? ?Agent: Please be advised that RX refills may take up to 3 business days. We ask that you follow-up with your pharmacy.  ?

## 2021-10-22 ENCOUNTER — Ambulatory Visit: Payer: BC Managed Care – PPO | Admitting: Medical

## 2021-10-29 ENCOUNTER — Ambulatory Visit (INDEPENDENT_AMBULATORY_CARE_PROVIDER_SITE_OTHER): Payer: BC Managed Care – PPO | Admitting: Medical

## 2021-10-29 VITALS — BP 130/70 | HR 64 | Temp 98.0°F | Resp 18 | Ht 70.0 in | Wt 204.0 lb

## 2021-10-29 DIAGNOSIS — E1165 Type 2 diabetes mellitus with hyperglycemia: Secondary | ICD-10-CM | POA: Diagnosis not present

## 2021-10-29 DIAGNOSIS — R1011 Right upper quadrant pain: Secondary | ICD-10-CM | POA: Diagnosis not present

## 2021-10-29 DIAGNOSIS — R300 Dysuria: Secondary | ICD-10-CM

## 2021-10-29 DIAGNOSIS — M792 Neuralgia and neuritis, unspecified: Secondary | ICD-10-CM

## 2021-10-29 DIAGNOSIS — R0781 Pleurodynia: Secondary | ICD-10-CM | POA: Diagnosis not present

## 2021-10-29 DIAGNOSIS — Z125 Encounter for screening for malignant neoplasm of prostate: Secondary | ICD-10-CM

## 2021-10-29 DIAGNOSIS — E782 Mixed hyperlipidemia: Secondary | ICD-10-CM | POA: Diagnosis not present

## 2021-10-29 DIAGNOSIS — R252 Cramp and spasm: Secondary | ICD-10-CM

## 2021-10-29 LAB — POC URINALSYSI DIPSTICK (AUTOMATED)
Bilirubin, UA: NEGATIVE
Blood, UA: NEGATIVE
Glucose, UA: POSITIVE — AB
Leukocytes, UA: NEGATIVE
Nitrite, UA: NEGATIVE
Protein, UA: NEGATIVE
Spec Grav, UA: 1.01 (ref 1.010–1.025)
Urobilinogen, UA: 0.2 E.U./dL
pH, UA: 7 (ref 5.0–8.0)

## 2021-10-29 LAB — LIPID PANEL
Cholesterol: 115 mg/dL (ref 0–200)
HDL: 36.7 mg/dL — ABNORMAL LOW (ref >39.00)
LDL Cholesterol: 58 mg/dL (ref 0–99)
NonHDL: 78.26
Total CHOL/HDL Ratio: 3
Triglycerides: 100 mg/dL (ref 0.0–149.0)
VLDL: 20 mg/dL (ref 0.0–40.0)

## 2021-10-29 LAB — COMPREHENSIVE METABOLIC PANEL
ALT: 43 U/L (ref 0–53)
AST: 24 U/L (ref 0–37)
Albumin: 4.9 g/dL (ref 3.5–5.2)
Alkaline Phosphatase: 68 U/L (ref 39–117)
BUN: 19 mg/dL (ref 6–23)
CO2: 31 mEq/L (ref 19–32)
Calcium: 9.7 mg/dL (ref 8.4–10.5)
Chloride: 99 mEq/L (ref 96–112)
Creatinine, Ser: 0.73 mg/dL (ref 0.40–1.50)
GFR: 102.78 mL/min (ref >60.00)
Glucose, Bld: 155 mg/dL — ABNORMAL HIGH (ref 70–99)
Potassium: 5.2 mEq/L — ABNORMAL HIGH (ref 3.5–5.1)
Sodium: 137 mEq/L (ref 135–145)
Total Bilirubin: 0.6 mg/dL (ref 0.2–1.2)
Total Protein: 6.8 g/dL (ref 6.0–8.3)

## 2021-10-29 LAB — HEMOGLOBIN A1C: Hgb A1c MFr Bld: 8.5 % — ABNORMAL HIGH (ref 4.6–6.5)

## 2021-10-29 LAB — PSA: PSA: 0.7 ng/mL (ref 0.10–4.00)

## 2021-10-29 LAB — LIPASE: Lipase: 16 U/L (ref 11.0–59.0)

## 2021-10-29 LAB — MAGNESIUM: Magnesium: 2.1 mg/dL (ref 1.5–2.5)

## 2021-10-29 MED ORDER — FAMCICLOVIR 500 MG PO TABS: 500.0000 mg | ORAL_TABLET | Freq: Three times a day (TID) | ORAL | 0 refills | Status: DC

## 2021-10-29 NOTE — Progress Notes (Signed)
? ?Subjective:  ? ? Patient ID: Brian Pittman, male    DOB: 02/20/1966, 56 y.o.   MRN: [id number] ? ?HPI ?Pt in with rt side rib area and rt upper abdomen pain for one week. Slight burning pain. The burn is constant. Sometimes pain seems positional. No cough, no pain after eating, no fever, no nausea or vomiting.  ? ?Pt is diabetic. His a1c was 7.4. He is on  farxiga and glipizide. Injectable in past ? ?High cholesterol and pt is on crestor 10 mg daily. ? ? ? ? ?Review of Systems  ?Constitutional:  Negative for chills and fatigue.  ?Respiratory:  Negative for cough, chest tightness, shortness of breath and wheezing.   ?Cardiovascular:  Negative for chest pain and palpitations.  ?Gastrointestinal:  Negative for abdominal pain.  ?Genitourinary:  Negative for dysuria, flank pain, frequency, genital sores and hematuria.  ?Musculoskeletal:  Negative for back pain.  ?Neurological:  Negative for dizziness and headaches.  ?Hematological:  Negative for adenopathy. Does not bruise/bleed easily.  ?Psychiatric/Behavioral:  Negative for behavioral problems, confusion, dysphoric mood, self-injury and sleep disturbance. The patient is not nervous/anxious.   ? ? ?Past Medical History:  ?Diagnosis Date  ? Body mass index (BMI) 31.0-31.9, adult   ? DM2 (diabetes mellitus, type 2) (HCC)   ? Ear infection   ? Generalized anxiety disorder 06/28/2018  ? GERD (gastroesophageal reflux disease)   ? Hypercholesterolemia   ? Panic disorder (episodic paroxysmal anxiety) 09/14/2016  ? Pneumonia   ? Sleep apnea   ? Trochanteric bursitis of right hip 05/17/2018  ? ?  ?Social History  ? ?Socioeconomic History  ? Marital status: Single  ?  Spouse name: Not on file  ? Number of children: Not on file  ? Years of education: Not on file  ? Highest education level: Not on file  ?Occupational History  ? Occupation: Proofreader  ?  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 Smoking status: Former  ? Smokeless tobacco: Former  ?  Types: Snuff  ?   Quit date: 10/2018  ? Tobacco comments:  ?  quit over 20 years ago  ?Vaping Use  ? Vaping Use: Never used  ?Substance and Sexual Activity  ? Alcohol use: Yes  ?  Comment: drinks alchohol on a regular basis, typically 1 beer daily  ? Drug use: No  ? Sexual activity: Not on file  ?Other Topics Concern  ? Not on file  ?Social History Narrative  ? Not on file  ? ?Social Determinants of Health  ? ?Financial Resource Strain: Not on file  ?Food Insecurity: Not on file  ?Transportation Needs: Not on file  ?Physical Activity: Not on file  ?Stress: Not on file  ?Social Connections: Not on file  ?Intimate Partner Violence: Not on file  ? ? ?Past Surgical History:  ?Procedure Laterality Date  ? CHOLECYSTECTOMY    ? COLONOSCOPY    ? Dr Rayfield Citizen around 2016 said it was normal  ? ? ?Family History  ?Problem Relation Age of Onset  ? Diabetes Mother   ? Diabetes Father   ? Leukemia Father   ? Skin cancer Father   ? Heart attack Father   ? Atrial fibrillation Father   ? Lung cancer Maternal Grandfather   ? Heart attack Paternal Grandmother   ? Leukemia Paternal Grandmother   ? Stroke Paternal Grandfather   ? Leukemia Paternal Grandfather   ? GER disease Sister   ? Arthritis Sister   ?  Colon cancer Neg Hx   ? Esophageal cancer Neg Hx   ? Stomach cancer Neg Hx   ? ? ?Allergies  ?Allergen Reactions  ? Jardiance [Empagliflozin]   ? Metformin Diarrhea  ? Ozempic (0.25 Or 0.5 Mg-Dose) [Semaglutide(0.25 Or 0.5mg -Dos)] Nausea Only  ? Penicillins Hives and Itching  ? Prednisone   ?  Elevates blood surgar, increased heart rate  ? Ertugliflozin Rash  ? Lovastatin Rash  ? ? ?Current Outpatient Medications on File Prior to Visit  ?Medication Sig Dispense Refill  ? dapagliflozin propanediol (FARXIGA) 10 MG TABS tablet Take 1 tablet (10 mg total) by mouth daily. 30 tablet 0  ? glipiZIDE (GLUCOTROL) 5 MG tablet Take 2 tablets (10 mg total) by mouth daily before breakfast AND 1.5 tablets (7.5 mg total) daily before supper. 315 tablet 3  ? meloxicam  (MOBIC) 7.5 MG tablet Take 1 tablet (7.5 mg total) by mouth 2 (two) times daily as needed. 45 tablet 1  ? omeprazole (PRILOSEC) 40 MG capsule TAKE 1 CAPSULE BY MOUTH DAILY 90 capsule 0  ? rosuvastatin (CRESTOR) 10 MG tablet TAKE 1 TABLET BY MOUTH DAILY 90 tablet 1  ? sodium polystyrene (KAYEXALATE) 15 GM/60ML suspension 60 ml twice daily for 3 days 360 mL 0  ? ?No current facility-administered medications on file prior to visit.  ? ? ?BP 130/70   Pulse 64   Temp 98 ?F (36.7 ?C)   Resp 18   Ht 5\' 10"  (1.778 m)   Wt 204 lb (92.5 kg)   SpO2 99%   BMI 29.27 kg/m?  ?  ?   ?Objective:  ? Physical Exam ? ?General ?Mental Status- Alert. General Appearance- Not in acute distress.  ? ?Skin ?On inspection skin right side abdomen and rib.  No obvious vesicular eruption.  On palpation of skin and slight tenderness.  No redness, no induration or warmth. ? ?Neck ?Carotid Arteries- Normal color. Moisture- Normal Moisture. No carotid bruits. No JVD. ? ?Chest and Lung Exam ?Auscultation: ?Breath Sounds:-Normal. ? ?Cardiovascular ?Auscultation:Rythm- Regular. ?Murmurs & Other Heart Sounds:Auscultation of the heart reveals- No Murmurs. ? ?Abdomen ?Inspection:-Inspeection Normal. ?Palpation/Percussion:Note:No mass. Palpation and Percussion of the abdomen reveal- Non Tender, Non Distended + BS, no rebound or guarding. ? ? ?Neurologic ?Cranial Nerve exam:- CN III-XII intact(No nystagmus), symmetric smile. ?Strength:- 5/5 equal and symmetric strength both upper and lower extremities.  ? ?Back-no CVA tenderness. ? ? ?   ?Assessment & Plan:  ? ?Patient Instructions  ?Diabetes with A1c's historically 7 or above.  On Farxiga and glipizide presently.  We will get A1c and metabolic panel today.  Discussion of benefit of endocrinologist over our office particularly in the event you need insulin in the future.  ? ?Hyperlipidemia-continue Crestor and follow lipid panel today. ? ?Blood pressure relatively well controlled.  If blood pressure  does increase in the future would recommend ACE inhibitor or ARB medication for kidney protection. ? ?Sharp rib region and right upper quadrant region pain.  This pain seems to be neuropathic type pain.  Considering possible early shingles eruption.  Prescribing Famvir antiviral.  If pain worsens or changes let us know as that would indicate need to expand our work-up. ? ?Since some abdomen region pain reported today and adding lipase to labs. ? ?Some daily dysuria since using Farxiga.  We will get POCT urinalysis.  If any abnormal findings then get culture. ? ?Included PSA/prostate screening protein on labs today. ? ?For intermittent leg cramps adding magnesium to labs and follow  potassium level. ? ?Follow-up date to be determined after lab review. ? ?  ?Esperanza Richters, PA-C  ?

## 2021-10-29 NOTE — Patient Instructions (Signed)
Diabetes with A1c's historically 7 or above.  On Farxiga and glipizide presently.  We will get A1c and metabolic panel today.  Discussion of benefit of endocrinologist over our office particularly in the event you need insulin in the future.  ? ?Hyperlipidemia-continue Crestor and follow lipid panel today. ? ?Blood pressure relatively well controlled.  If blood pressure does increase in the future would recommend ACE inhibitor or ARB medication for kidney protection. ? ?Sharp rib region and right upper quadrant region pain.  This pain seems to be neuropathic type pain.  Considering possible early shingles eruption.  Prescribing Famvir antiviral.  If pain worsens or changes let us know as that would indicate need to expand our work-up. ? ?Since some abdomen region pain reported today and adding lipase to labs. ? ?Some daily dysuria since using Farxiga.  We will get POCT urinalysis.  If any abnormal findings then get culture. ? ?Included PSA/prostate screening protein on labs today. ? ?For intermittent leg cramps adding magnesium to labs and follow potassium level. ? ?Follow-up date to be determined after lab review. ? ? ?

## 2021-10-30 ENCOUNTER — Encounter: Payer: Self-pay | Admitting: Medical

## 2021-10-30 LAB — URINE CULTURE
MICRO NUMBER:: 13253899
Result:: NO GROWTH
SPECIMEN QUALITY:: ADEQUATE

## 2021-10-31 ENCOUNTER — Other Ambulatory Visit: Payer: Self-pay | Admitting: Medical

## 2021-11-10 ENCOUNTER — Other Ambulatory Visit: Payer: Self-pay | Admitting: Internal Medicine

## 2021-11-10 MED ORDER — LEVOCETIRIZINE DIHYDROCHLORIDE 5 MG PO TABS: 5.0000 mg | ORAL_TABLET | Freq: Every evening | ORAL | 3 refills | Status: DC

## 2021-11-10 NOTE — Addendum Note (Signed)
Addended by: Anabel Halon on: 11/10/2021 07:11 PM ? ? Modules accepted: Orders ? ?

## 2021-11-22 DIAGNOSIS — D225 Melanocytic nevi of trunk: Secondary | ICD-10-CM | POA: Diagnosis not present

## 2021-11-22 DIAGNOSIS — D485 Neoplasm of uncertain behavior of skin: Secondary | ICD-10-CM | POA: Diagnosis not present

## 2021-11-22 DIAGNOSIS — D2239 Melanocytic nevi of other parts of face: Secondary | ICD-10-CM | POA: Diagnosis not present

## 2021-11-22 DIAGNOSIS — L570 Actinic keratosis: Secondary | ICD-10-CM | POA: Diagnosis not present

## 2021-12-08 DIAGNOSIS — C44519 Basal cell carcinoma of skin of other part of trunk: Secondary | ICD-10-CM | POA: Diagnosis not present

## 2022-01-29 ENCOUNTER — Encounter: Payer: Self-pay | Admitting: Internal Medicine

## 2022-01-29 ENCOUNTER — Ambulatory Visit (INDEPENDENT_AMBULATORY_CARE_PROVIDER_SITE_OTHER): Payer: BC Managed Care – PPO | Admitting: Internal Medicine

## 2022-01-29 VITALS — BP 132/80 | HR 64 | Ht 70.0 in | Wt 197.6 lb

## 2022-01-29 DIAGNOSIS — E119 Type 2 diabetes mellitus without complications: Secondary | ICD-10-CM | POA: Insufficient documentation

## 2022-01-29 LAB — POCT GLYCOSYLATED HEMOGLOBIN (HGB A1C): Hemoglobin A1C: 6.9 % — AB (ref 4.0–5.6)

## 2022-01-29 MED ORDER — QTERN 10-5 MG PO TABS: 1.0000 | ORAL_TABLET | Freq: Every day | ORAL | 11 refills | Status: DC

## 2022-01-29 MED ORDER — GLIPIZIDE 5 MG PO TABS: ORAL_TABLET | ORAL | 3 refills | Status: DC

## 2022-01-29 NOTE — Progress Notes (Signed)
Name: Brian DrumJeffrey Feliz  Age/ Sex: 56 y.o., male   MRN/ DOB: [id number], 12/01/1965     PCP: Marisue BrooklynSaguier, Edward, PA-C   Reason for Endocrinology Evaluation: Type 2 Diabetes Mellitus  Initial Endocrine Consultative Visit: 02/14/2020    PATIENT IDENTIFIER: Brian Pittman is a 56 y.o. male with a past medical history of T2DM and Dyslipidemia. The patient has followed with Endocrinology clinic since 02/14/2020 for consultative assistance with management of his diabetes.  DIABETIC HISTORY:  Brian Pittman was diagnosed with DM in 2015,metformin - GI side effects . Ozempic/Trulicity - sour stomach and gas . His hemoglobin A1c has ranged from 7.1% in 08/2019, peaking at 7.5% in 03/2020.  On his initial to our clinic he had an A1c of 7.1 %. He was on Amaryl and Qtern. We switched Glimepiride to Glipizide and continued Qtern but by 07/2020 Brian Pittman became cost prohibitve and started [place]xiga   SUBJECTIVE:   During the last visit (03/18/2021): A1c 7.4 % We increased  Glipizide and continued [place]xiga    Today (01/29/2022): Brian Pittman is here for a follow up on diabetes.  The patient has not been to our clinic in 11 months.  He checks his blood sugars 2 times daily. The patient has not had hypoglycemic episodes since the last clinic visit.    Has occasional burning and feeling of pin needles in the morning   Was seen yesterday by PCP for posterior right knee pain for the past month, radiates down to the calves   HOME DIABETES REGIMEN:  Glipizide 5 mg 2 tabs before breakfast and 1.5 tabs before supper Farxiga 10 mg daily      Statin: Yes ACE-I/ARB: no   GLUCOSE LOG:  119-179 mg/dL     DIABETIC COMPLICATIONS: Microvascular complications:   Denies: CKD, neuropathy, retinopathy  Last Eye Exam: Completed 2022 Dr. Hazle Quantigby   Macrovascular complications:   Denies: CAD, CVA, PVD   HISTORY:  Past Medical History:  Past Medical History:  Diagnosis Date   Body mass index (BMI) 31.0-31.9, adult    DM2  (diabetes mellitus, type 2) (HCC)    Ear infection    Generalized anxiety disorder 06/28/2018   GERD (gastroesophageal reflux disease)    Hypercholesterolemia    Panic disorder (episodic paroxysmal anxiety) 09/14/2016   Pneumonia    Sleep apnea    Trochanteric bursitis of right hip 05/17/2018   Past Surgical History:  Past Surgical History:  Procedure Laterality Date   CHOLECYSTECTOMY     COLONOSCOPY     Dr Rayfield CitizenBulter around 2016 said it was normal   Social History:  reports that he has never smoked. He quit smokeless tobacco use about 3 years ago.  His smokeless tobacco use included snuff. He reports current alcohol use. He reports that he does not use drugs. Family History:  Family History  Problem Relation Age of Onset   Diabetes Mother    Diabetes Father    Leukemia Father    Skin cancer Father    Heart attack Father    Atrial fibrillation Father    Lung cancer Maternal Grandfather    Heart attack Paternal Grandmother    Leukemia Paternal Grandmother    Stroke Paternal Grandfather    Leukemia Paternal Grandfather    GER disease Sister    Arthritis Sister    Colon cancer Neg Hx    Esophageal cancer Neg Hx    Stomach cancer Neg Hx      HOME MEDICATIONS: Allergies as of 01/29/2022  Reactions   Jardiance [empagliflozin]    Metformin Diarrhea   Ozempic (0.25 Or 0.5 Mg-dose) [semaglutide(0.25 Or 0.5mg -dos)] Nausea Only   Penicillins Hives, Itching   Prednisone    Elevates blood surgar, increased heart rate   Ertugliflozin Rash   Lovastatin Rash        Medication List        Accurate as of January 29, 2022  8:17 AM. If you have any questions, ask your nurse or doctor.          famciclovir 500 MG tablet Commonly known as: FAMVIR Take 1 tablet (500 mg total) by mouth 3 (three) times daily.   Farxiga 10 MG Tabs tablet Generic drug: dapagliflozin propanediol TAKE 1 TABLET BY MOUTH DAILY   glipiZIDE 5 MG tablet Commonly known as: GLUCOTROL Take 2  tablets (10 mg total) by mouth daily before breakfast AND 1.5 tablets (7.5 mg total) daily before supper.   levocetirizine 5 MG tablet Commonly known as: XYZAL Take 1 tablet (5 mg total) by mouth every evening.   meloxicam 7.5 MG tablet Commonly known as: Mobic Take 1 tablet (7.5 mg total) by mouth 2 (two) times daily as needed.   omeprazole 40 MG capsule Commonly known as: PRILOSEC TAKE 1 CAPSULE BY MOUTH DAILY   rosuvastatin 10 MG tablet Commonly known as: CRESTOR TAKE 1 TABLET BY MOUTH DAILY   sodium polystyrene 15 GM/60ML suspension Commonly known as: KAYEXALATE 60 ml twice daily for 3 days         OBJECTIVE:   Vital Signs: BP 132/80 (BP Location: Left Arm, Patient Position: Sitting, Cuff Size: Small)   Pulse 64   Ht 5\' 10"  (1.778 m)   Wt 197 lb 9.6 oz (89.6 kg)   SpO2 98%   BMI 28.35 kg/m   Wt Readings from Last 3 Encounters:  01/29/22 197 lb 9.6 oz (89.6 kg)  10/29/21 204 lb (92.5 kg)  05/21/21 201 lb (91.2 kg)     Exam: General: Pt appears well and is in NAD  Lungs: Clear with good BS bilat with no rales, rhonchi, or wheezes  Heart: RRR   Abdomen: Normoactive bowel sounds, soft, nontender, without masses or organomegaly palpable  Extremities: No pretibial edema.   Neuro: MS is good with appropriate affect, pt is alert and Ox3     DM foot exam: 01/29/2022   The skin of the feet is intact without sores or ulcerations. The pedal pulses are 1+ on right and 1 + on left. The sensation is intact to a screening 5.07, 10 gram monofilament bilaterally      DATA REVIEWED:  Lab Results  Component Value Date   HGBA1C 6.9 (A) 01/29/2022   HGBA1C 8.5 (H) 10/29/2021   HGBA1C 7.4 (A) 03/18/2021    Latest Reference Range & Units 10/29/21 08:41  Sodium 135 - 145 mEq/L 137  Potassium 3.5 - 5.1 mEq/L 5.2 No hemolysis seen (H)  Chloride 96 - 112 mEq/L 99  CO2 19 - 32 mEq/L 31  Glucose 70 - 99 mg/dL 12/29/21 (H)  BUN 6 - 23 mg/dL 19  Creatinine 672 - 0.94 mg/dL  7.09  Calcium 8.4 - 6.28 mg/dL 9.7  Magnesium 1.5 - 2.5 mg/dL 2.1  Alkaline Phosphatase 39 - 117 U/L 68  Albumin 3.5 - 5.2 g/dL 4.9  Lipase 36.6 - 29.4 U/L 16.0  AST 0 - 37 U/L 24  ALT 0 - 53 U/L 43  Total Protein 6.0 - 8.3 g/dL 6.8  Total Bilirubin 0.2 -  1.2 mg/dL 0.6  GFR >56.43 mL/min 102.78      ASSESSMENT / PLAN / RECOMMENDATIONS:   1) Type 2 Diabetes Mellitus, Optimally  controlled, Without complications - Most recent A1c of 6.9  %. Goal A1c < 7.0 %.     - Praised the pt on Improved glucose control  - Intolerant to Metformin and GLP-1 agonists  - He has a high  deductible insurance and having to pay cash for farxiga > $300. I have offered to switch to pioglitazone knowing that the benefits of farxiga is superior , he is comfortable with this regimen for now, we also discussed switching back to [place] as it turns out its the same cost for him , as I initially thought Brian Pittman can be cheaper  - Will decrease Glipizide to prevent hypoglycemia      MEDICATIONS: -  Decrease  Glipizide 5 mg, 2 tablets before Breakfast and continue 1 tablets before supper  - Switch Farxiga to Qtern 10-5 mg daily    EDUCATION / INSTRUCTIONS: BG monitoring instructions: Patient is instructed to check his blood sugars 1 times a day Call [place] [facility] if: BG persistently < 70  I reviewed the Rule of 15 for the treatment of hypoglycemia in detail with the patient. Literature supplied.    2) Diabetic complications:  Eye: Does not have known diabetic retinopathy.  Neuro/ Feet: Does not have known diabetic peripheral neuropathy .  Renal: Patient does not have known baseline CKD. He   Is not  on an ACEI/ARB at present.      F/U in 6 months    Signed electronically by: Lyndle Herrlich, MD  [facility]  [facility] [address] [place], [place] [address] Phone: [phone] FAX: [phone]   CC: [name] [id number]  [facility] [address] [place] [address] Phone: [phone]  Fax: [phone]  Return to Endocrinology clinic as below: No future appointments.

## 2022-01-29 NOTE — Patient Instructions (Addendum)
-   Switch farxiga to Qtern 10-5 mg , 1 tablet daily  - Change Glipizide 5 mg, 2 tablets before Breakfast and 1 tablets before supper       HOW TO TREAT LOW BLOOD SUGARS (Blood sugar LESS THAN 70 MG/DL) Please follow the RULE OF 15 for the treatment of hypoglycemia treatment (when your (blood sugars are less than 70 mg/dL)   STEP 1: Take 15 grams of carbohydrates when your blood sugar is low, which includes:  3-4 GLUCOSE TABS  OR 3-4 OZ OF JUICE OR REGULAR SODA OR ONE TUBE OF GLUCOSE GEL    STEP 2: RECHECK blood sugar in 15 MINUTES STEP 3: If your blood sugar is still low at the 15 minute recheck --> then, go back to STEP 1 and treat AGAIN with another 15 grams of carbohydrates.

## 2022-01-30 ENCOUNTER — Other Ambulatory Visit: Payer: Self-pay | Admitting: Medical

## 2022-02-02 MED ORDER — OMEPRAZOLE 40 MG PO CPDR: 40.0000 mg | DELAYED_RELEASE_CAPSULE | Freq: Every day | ORAL | 0 refills | Status: DC

## 2022-02-11 ENCOUNTER — Other Ambulatory Visit: Payer: Self-pay | Admitting: Internal Medicine

## 2022-02-11 ENCOUNTER — Encounter: Payer: Self-pay | Admitting: Internal Medicine

## 2022-02-11 ENCOUNTER — Other Ambulatory Visit: Payer: Self-pay

## 2022-02-11 MED ORDER — QTERN 10-5 MG PO TABS: 1.0000 | ORAL_TABLET | Freq: Every day | ORAL | 11 refills | Status: DC

## 2022-02-12 ENCOUNTER — Telehealth: Payer: Self-pay | Admitting: Pharmacy Technician

## 2022-02-12 ENCOUNTER — Other Ambulatory Visit (HOSPITAL_COMMUNITY): Payer: Self-pay

## 2022-02-12 NOTE — Telephone Encounter (Signed)
Patient Advocate Encounter   Received notification from CMA/pt that prior authorization for [name] is required/requested.   PA submitted on 02/12/22 to [facility] [employer] via CoverMyMeds - sent as urgent Key [id number] Status is pending  Pharmacy Patient Advocate Fax:  [phone]

## 2022-02-17 ENCOUNTER — Other Ambulatory Visit (HOSPITAL_COMMUNITY): Payer: Self-pay

## 2022-02-17 NOTE — Telephone Encounter (Signed)
Patient Advocate Encounter  Received notification from [facility] that the request for prior authorization for Brian Pittman has been denied due to not meeting the definition of Medical Necessity found in the member's benefit booklet.  This medication is not on formulary. The member must try and fail (did not work), or be unable to take ALL formulary alternatives (due to interactions, side effects, etc.). Formulary alternatives are Glyxambi and Trijardy XR   Glyxambi goes through a test claim for $0, but the Trijardy dose not.   Specialty Pharmacy Patient Advocate Fax:  [phone]

## 2022-02-19 ENCOUNTER — Encounter: Payer: Self-pay | Admitting: Internal Medicine

## 2022-02-19 ENCOUNTER — Telehealth: Payer: Self-pay

## 2022-02-19 ENCOUNTER — Other Ambulatory Visit: Payer: Self-pay

## 2022-02-19 MED ORDER — GLYXAMBI 25-5 MG PO TABS: 1.0000 | ORAL_TABLET | Freq: Every day | ORAL | 3 refills | Status: DC

## 2022-02-19 NOTE — Addendum Note (Signed)
Addended by: Scarlette Shorts on: 02/19/2022 03:35 AM   Modules accepted: Orders

## 2022-02-19 NOTE — Telephone Encounter (Signed)
Glyxambi was sent to the pharmacy and a portal message sent to the pt

## 2022-02-19 NOTE — Telephone Encounter (Signed)
Pharmacy is unable to get the [facility] and would like to just stay on the Farxiga until he next appointment

## 2022-03-21 ENCOUNTER — Emergency Department (HOSPITAL_BASED_OUTPATIENT_CLINIC_OR_DEPARTMENT_OTHER)
Admission: EM | Admit: 2022-03-21 | Discharge: 2022-03-21 | Disposition: A | Discharge status: Home / Self Care | Payer: BC Managed Care – PPO | Attending: Emergency Medicine | Admitting: Emergency Medicine

## 2022-03-21 ENCOUNTER — Encounter (HOSPITAL_BASED_OUTPATIENT_CLINIC_OR_DEPARTMENT_OTHER): Payer: Self-pay | Admitting: Emergency Medicine

## 2022-03-21 ENCOUNTER — Other Ambulatory Visit: Payer: Self-pay

## 2022-03-21 DIAGNOSIS — E119 Type 2 diabetes mellitus without complications: Secondary | ICD-10-CM | POA: Diagnosis not present

## 2022-03-21 DIAGNOSIS — S61412A Laceration without foreign body of left hand, initial encounter: Secondary | ICD-10-CM | POA: Diagnosis not present

## 2022-03-21 DIAGNOSIS — Z203 Contact with and (suspected) exposure to rabies: Secondary | ICD-10-CM | POA: Insufficient documentation

## 2022-03-21 DIAGNOSIS — W5581XA Bitten by other mammals, initial encounter: Secondary | ICD-10-CM | POA: Diagnosis not present

## 2022-03-21 DIAGNOSIS — Z23 Encounter for immunization: Secondary | ICD-10-CM | POA: Insufficient documentation

## 2022-03-21 DIAGNOSIS — S6992XA Unspecified injury of left wrist, hand and finger(s), initial encounter: Secondary | ICD-10-CM | POA: Diagnosis not present

## 2022-03-21 DIAGNOSIS — Z7984 Long term (current) use of oral hypoglycemic drugs: Secondary | ICD-10-CM | POA: Diagnosis not present

## 2022-03-21 DIAGNOSIS — Z209 Contact with and (suspected) exposure to unspecified communicable disease: Secondary | ICD-10-CM

## 2022-03-21 DIAGNOSIS — S61452A Open bite of left hand, initial encounter: Secondary | ICD-10-CM | POA: Diagnosis not present

## 2022-03-21 MED ORDER — RABIES VACCINE, PCEC IM SUSR
1.0000 mL | Freq: Once | INTRAMUSCULAR | Status: AC
  Administered 2022-03-21: 1 mL via INTRAMUSCULAR
  Filled 2022-03-21: qty 1

## 2022-03-21 MED ORDER — RABIES IMMUNE GLOBULIN 150 UNIT/ML IM INJ
20.0000 [IU]/kg | INJECTION | Freq: Once | INTRAMUSCULAR | Status: DC
Start: 2022-03-21 — End: 2022-03-21
  Filled 2022-03-21: qty 12

## 2022-03-21 MED ORDER — RABIES IMMUNE GLOBULIN 150 UNIT/ML IM INJ
20.0000 [IU]/kg | INJECTION | Freq: Once | INTRAMUSCULAR | Status: AC
  Administered 2022-03-21: 1725 [IU]
  Filled 2022-03-21: qty 2

## 2022-03-21 NOTE — Discharge Instructions (Addendum)
                                  RABIES VACCINE FOLLOW UP  Patient's Name: Brian Pittman                     Original Order Date:03/21/2022  Medical Record Number: [id number]  ED Physician: Maia Plan, MD Primary Diagnosis: Rabies Exposure       PCP: [name]  Patient Phone Number: (home) [phone] (home)    (cell)  Telephone Information:  Mobile [phone]    (work) There is no work phone number on file. Species of Animal:     You have been seen in the Emergency Department for a possible rabies exposure. It's very important you return for the additional vaccine doses.  Please call the clinic listed below for hours of operation.   Clinic that will administer your rabies vaccines: [facility] [address] [place], [place] [address] [phone]  DAY 0:  03/21/2022      DAY 3:  03/24/2022       DAY 7:  03/28/2022     DAY 14:  04/04/2022         The 5th vaccine injection is considered for immune compromised patients only.  DAY 28:  04/18/2022

## 2022-03-21 NOTE — ED Provider Notes (Cosign Needed Addendum)
[facility] EMERGENCY DEPARTMENT Provider Note   CSN: [id number] Arrival date & time: 03/21/22  1021     History  Chief Complaint  Patient presents with   Bat Exposure    Brian Pittman is a 56 y.o. male presenting today due to a bat encounter just a few hours ago.  Was weed eating outside of his barn when something flew up and hit him in the mouth.  He swiped it immediately with his left arm and pushed it away.  This point he noticed that it was a bat.  A few minutes later looked down to his left arm and noticed a small laceration with some bleeding.  Came to the ED for evaluation.  Tdap updated 2 to 3 years ago.  No Hx of rabies vaccination.  No other concerns at this time.  The history is provided by the patient and medical records.     Home Medications Prior to Admission medications   Medication Sig Start Date End Date Taking? Authorizing Provider  Dapagliflozin-sAXagliptin (QTERN) 10-5 MG TABS Take 1 tablet by mouth daily in the afternoon. 02/11/22   Shamleffer, Melanie Crazier, MD  Empagliflozin-linaGLIPtin (GLYXAMBI) 25-5 MG TABS Take 1 tablet by mouth daily. 02/19/22   Shamleffer, Melanie Crazier, MD  famciclovir (FAMVIR) 500 MG tablet Take 1 tablet (500 mg total) by mouth 3 (three) times daily. 10/29/21   Saguier, Percell Miller, PA-C  glipiZIDE (GLUCOTROL) 5 MG tablet Take 2 tablets (10 mg total) by mouth daily before breakfast AND 1 tablet (5 mg total) daily before supper. 01/29/22   Shamleffer, Melanie Crazier, MD  levocetirizine (XYZAL) 5 MG tablet Take 1 tablet (5 mg total) by mouth every evening. 11/10/21   Saguier, Percell Miller, PA-C  meloxicam (MOBIC) 7.5 MG tablet Take 1 tablet (7.5 mg total) by mouth 2 (two) times daily as needed. 05/21/21   Rosemarie Ax, MD  omeprazole (PRILOSEC) 40 MG capsule Take 1 capsule (40 mg total) by mouth daily. 02/02/22   Saguier, Percell Miller, PA-C  rosuvastatin (CRESTOR) 10 MG tablet TAKE 1 TABLET BY MOUTH DAILY 10/07/21   Saguier, Percell Miller, PA-C  sodium  polystyrene (KAYEXALATE) 15 GM/60ML suspension 60 ml twice daily for 3 days 06/29/20   Saguier, Percell Miller, PA-C     Allergies    Jardiance [empagliflozin], Metformin, Ozempic (0.25 or 0.5 mg-dose) [semaglutide(0.25 or 0.5mg -dos)], Penicillins, Prednisone, Ertugliflozin, and Lovastatin     Review of Systems   Review of Systems  Skin:  Positive for wound.    Physical Exam Updated Vital Signs BP (!) 127/92   Pulse 61   Temp 98.1 F (36.7 C) (Oral)   Resp 18   Ht 5\' 10"  (1.778 m)   Wt 87.1 kg   SpO2 99%   BMI 27.55 kg/m  Physical Exam Vitals and nursing note reviewed.  Constitutional:      General: He is not in acute distress.    Appearance: Normal appearance. He is well-developed. He is not ill-appearing, toxic-appearing or diaphoretic.  HENT:     Head: Normocephalic and atraumatic.     Comments: No new trauma, laceration, abrasion, swelling, or bruising to the mouth, nose, throat, eyes, or face.    Right Ear: External ear normal.     Left Ear: External ear normal.     Nose: Nose normal.     Mouth/Throat:     Mouth: Mucous membranes are moist.     Pharynx: Oropharynx is clear.  Eyes:     Conjunctiva/sclera: Conjunctivae normal.  Neck:  Comments: Very supple on exam, no meningismus Cardiovascular:     Rate and Rhythm: Normal rate and regular rhythm.     Heart sounds: No murmur heard. Pulmonary:     Effort: Pulmonary effort is normal. No respiratory distress.     Breath sounds: Normal breath sounds.  Abdominal:     Palpations: Abdomen is soft.     Tenderness: There is no abdominal tenderness.  Musculoskeletal:        General: No swelling.     Cervical back: Neck supple. No rigidity.  Skin:    General: Skin is warm and dry.     Capillary Refill: Capillary refill takes less than 2 seconds.     Comments: Small 1 cm laceration to the dorsal aspect of the ring finger on left hand.  Bleeding controlled.  Without evidence of swelling, purulent drainage, or bony tenderness.   Neurological:     Mental Status: He is alert and oriented to person, place, and time.  Psychiatric:        Mood and Affect: Mood normal.     ED Results / Procedures / Treatments   Labs (all labs ordered are listed, but only abnormal results are displayed) Labs Reviewed - No data to display  EKG None  Radiology No results found.  Procedures Procedures    Medications Ordered in ED Medications  rabies immune globulin (HYPERRAB/KEDRAB) injection 1,725 Units (has no administration in time range)  rabies vaccine (RABAVERT) injection 1 mL (has no administration in time range)    ED Course/ Medical Decision Making/ A&P                           Medical Decision Making Risk Prescription drug management.   56 y.o. male presents to the ED for concern of Bat Exposure   This involves an extensive number of treatment options, and is a complaint that carries with it a high risk of complications and morbidity.    Past Medical History / Co-morbidities / Social History: Hx of GERD, DMT2, dyslipidemia Social Determinants of Health include: None  Additional History:  None  Lab Tests: None  Imaging Studies: None  ED Course: Pt well-appearing on exam.  Patient presents with laceration from a bat encounter.  Bat flew at and made contact with pt's mouth and he immediately swatted it away with left arm.  Noticed 1cm laceration to dorsal aspect ring finger on same hand a few minutes later.  No evidence of new trauma to the mouth/face.  Pt wound irrigated well with 18ga angiocath with sterile saline.  Wounds examined with visualization of the base and no foreign bodies seen.  Pt Alert and oriented, NAD, nontoxic, nonseptic appearing.  Capillary refill intact and pt without neurologic deficit.  Tetanus is up-to-date.  Patient rabies vaccine and immunoglobulin risk and benefit discussed.  Pt consents.  I performed the injection of IgG per protocol.  Wounds not closed secondary to concern  for infection.  Plan for discharge home with anti-inflammatories.  Considered PO antibiotics however due to minimal laceration and uncertainty if from bite or other bat mechanism, plan for close monitoring and PCP follow up.  Pt satisfied with today's encounter.  Patient in NAD and in good condition at time of discharge.  Disposition: After consideration the patient's encounter today, I do not feel today's workup suggests an emergent condition requiring admission or immediate intervention beyond what has been performed at this time.  Safe for discharge;  instructed to return immediately for worsening symptoms, change in symptoms or any other concerns.  I have reviewed the patients home medicines and have made adjustments as needed.  Discussed course of treatment with the patient, whom demonstrated understanding.  Patient in agreement and has no further questions.    I discussed this case with my attending physician Dr. Jacqulyn Bath, who agreed with the proposed treatment course and cosigned this note including patient's presenting symptoms, physical exam, and planned diagnostics and interventions.  Attending physician stated agreement with plan or made changes to plan which were implemented.     This chart was dictated using voice recognition software.  Despite best efforts to proofread, errors can occur which can change the documentation meaning.   Final Clinical Impression(s) / ED Diagnoses Final diagnoses:  Exposure to bat without known bite  Laceration of left hand without foreign body, initial encounter    Rx / DC Orders ED Discharge Orders     None         Cecil Cobbs, PA-C 03/21/22 1130    Cecil Cobbs, PA-C 03/21/22 1137    Cecil Cobbs, PA-C 03/21/22 1411    Maia Plan, MD 03/22/22 [phone]

## 2022-03-21 NOTE — ED Triage Notes (Signed)
Pt arrives pov, steady gait, reports bat exposure while weed eating this am. Pt reports bat "hit me in the face, then landed on left arm." Injury to left ring finger, bleeding controlled. Unsure if bitten, reports injury could be r/t injury from weed eating

## 2022-03-24 ENCOUNTER — Ambulatory Visit (HOSPITAL_COMMUNITY)
Admission: EM | Admit: 2022-03-24 | Discharge: 2022-03-24 | Disposition: A | Discharge status: Home / Self Care | Payer: BC Managed Care – PPO | Attending: Internal Medicine | Admitting: Internal Medicine

## 2022-03-24 DIAGNOSIS — Z203 Contact with and (suspected) exposure to rabies: Secondary | ICD-10-CM

## 2022-03-24 MED ORDER — RABIES VACCINE, PCEC IM SUSR
1.0000 mL | Freq: Once | INTRAMUSCULAR | Status: AC
  Administered 2022-03-24: 1 mL via INTRAMUSCULAR

## 2022-03-24 MED ORDER — RABIES VACCINE, PCEC IM SUSR
INTRAMUSCULAR | Status: AC
  Filled 2022-03-24: qty 1

## 2022-03-24 NOTE — ED Triage Notes (Signed)
Pt here for day 3 rabies injection 

## 2022-03-28 ENCOUNTER — Ambulatory Visit
Admission: EM | Admit: 2022-03-28 | Discharge: 2022-03-28 | Disposition: A | Discharge status: Home / Self Care | Payer: BC Managed Care – PPO | Attending: Internal Medicine | Admitting: Internal Medicine

## 2022-03-28 DIAGNOSIS — Z203 Contact with and (suspected) exposure to rabies: Secondary | ICD-10-CM | POA: Diagnosis not present

## 2022-03-28 MED ORDER — RABIES VACCINE, PCEC IM SUSR
1.0000 mL | Freq: Once | INTRAMUSCULAR | Status: AC
  Administered 2022-03-28: 1 mL via INTRAMUSCULAR

## 2022-03-28 NOTE — ED Triage Notes (Signed)
Pt here for rn visit for rabies vaccine. Administered to left arm without complication. Education provided.

## 2022-04-04 ENCOUNTER — Ambulatory Visit
Admission: EM | Admit: 2022-04-04 | Discharge: 2022-04-04 | Disposition: A | Discharge status: Home / Self Care | Payer: BC Managed Care – PPO | Attending: Physician Assistant | Admitting: Physician Assistant

## 2022-04-04 DIAGNOSIS — Z203 Contact with and (suspected) exposure to rabies: Secondary | ICD-10-CM | POA: Diagnosis not present

## 2022-04-04 MED ORDER — RABIES VACCINE, PCEC IM SUSR
1.0000 mL | Freq: Once | INTRAMUSCULAR | Status: AC
  Administered 2022-04-04: 1 mL via INTRAMUSCULAR

## 2022-04-04 NOTE — ED Triage Notes (Signed)
Pt here for third rabies vaccination/nurse visit.

## 2022-04-18 ENCOUNTER — Encounter: Payer: Self-pay | Admitting: Medical

## 2022-04-20 MED ORDER — ROSUVASTATIN CALCIUM 10 MG PO TABS: 10.0000 mg | ORAL_TABLET | Freq: Every day | ORAL | 1 refills | Status: DC

## 2022-04-27 ENCOUNTER — Ambulatory Visit (INDEPENDENT_AMBULATORY_CARE_PROVIDER_SITE_OTHER): Payer: BC Managed Care – PPO | Admitting: Medical

## 2022-04-27 VITALS — BP 126/64 | HR 58 | Temp 98.0°F | Resp 18 | Ht 70.0 in | Wt 197.4 lb

## 2022-04-27 DIAGNOSIS — E782 Mixed hyperlipidemia: Secondary | ICD-10-CM | POA: Diagnosis not present

## 2022-04-27 DIAGNOSIS — E1165 Type 2 diabetes mellitus with hyperglycemia: Secondary | ICD-10-CM | POA: Diagnosis not present

## 2022-04-27 DIAGNOSIS — M542 Cervicalgia: Secondary | ICD-10-CM | POA: Diagnosis not present

## 2022-04-27 DIAGNOSIS — K219 Gastro-esophageal reflux disease without esophagitis: Secondary | ICD-10-CM

## 2022-04-27 MED ORDER — CYCLOBENZAPRINE HCL 10 MG PO TABS: ORAL_TABLET | ORAL | 0 refills | Status: DC

## 2022-04-27 MED ORDER — MELOXICAM 7.5 MG PO TABS: ORAL_TABLET | ORAL | 0 refills | Status: DC

## 2022-04-27 NOTE — Progress Notes (Signed)
Subjective:    Patient ID: Brian Pittman, male    DOB: Oct 04, 1965, 56 y.o.   MRN: [id number]  HPI  Pt in for follow up.  On review last a1c was 6.9 2 months ago.   Pt sees Dr. Kelton Pillar.  - Switch farxiga to Qtern 10-5 mg , 1 tablet daily  - Change Glipizide 5 mg, 2 tablets before Breakfast and 1 tablets before supper    Hyperlipidemia- on crestor.  Jerrye Bushy- on prilosec.   Pt updates me he may have been bit by a bat wee eating around his barn. Pt go rabies vaccine and igG protocol received.   Pt state at end of rabies prevent tx he had some high bp reading 184/82 about 2 weeks ago. But check with machine at home.  Recent neck pain. Pt states pain more at night. Left trapezius pain constant/daily for one month. Tried differernt pillows.  No pain shooting down left arm. November 2022 xray was normal.  FINDINGS: There is no evidence of cervical spine fracture or prevertebral soft tissue swelling. Alignment is normal. No other significant bone abnormalities are identified.   IMPRESSION: No acute osseous abnormality identified.  Allergic rhinitis- controlled with xyzal.   Review of Systems  Constitutional:  Negative for chills, fatigue and fever.  Respiratory:  Negative for cough, chest tightness, shortness of breath and wheezing.   Cardiovascular:  Negative for chest pain and palpitations.  Gastrointestinal:  Negative for abdominal pain, constipation and nausea.  Genitourinary:  Negative for dysuria and frequency.  Musculoskeletal:  Positive for neck pain. Negative for back pain and joint swelling.  Neurological:  Negative for dizziness, numbness and headaches.  Hematological:  Negative for adenopathy. Does not bruise/bleed easily.  Psychiatric/Behavioral:  Negative for behavioral problems and confusion.     Past Medical History:  Diagnosis Date   Body mass index (BMI) 31.0-31.9, adult    DM2 (diabetes mellitus, type 2) (HCC)    Ear infection    Generalized anxiety  disorder 06/28/2018   GERD (gastroesophageal reflux disease)    Hypercholesterolemia    Panic disorder (episodic paroxysmal anxiety) 09/14/2016   Pneumonia    Sleep apnea    Trochanteric bursitis of right hip 05/17/2018     Social History   Socioeconomic History   Marital status: Single    Spouse name: Not on file   Number of children: Not on file   Years of education: Not on file   Highest education level: Not on file  Occupational History   Occupation: Education officer, 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[employer]  Tobacco Use   Smoking status: Never   Smokeless tobacco: Former    Types: Snuff    Quit date: 10/2018   Tobacco comments:    quit over 20 years ago  Vaping Use   Vaping Use: Never used  Substance and Sexual Activity   Alcohol use: Yes    Comment: drinks alchohol on a regular basis, typically 1 beer daily   Drug use: No   Sexual activity: Not on file  Other Topics Concern   Not on file  Social History Narrative   Not on file   Social Determinants of Health   Financial Resource Strain: Not on file  Food Insecurity: Not on file  Transportation Needs: Not on file  Physical Activity: Not on file  Stress: Not on file  Social Connections: Not on file  Intimate Partner Violence: Not on file    Past Surgical History:  Procedure Laterality  Date   CHOLECYSTECTOMY     COLONOSCOPY     Dr Rayfield Citizen around 2016 said it was normal    Family History  Problem Relation Age of Onset   Diabetes Mother    Diabetes Father    Leukemia Father    Skin cancer Father    Heart attack Father    Atrial fibrillation Father    Lung cancer Maternal Grandfather    Heart attack Paternal Grandmother    Leukemia Paternal Grandmother    Stroke Paternal Grandfather    Leukemia Paternal Grandfather    GER disease Sister    Arthritis Sister    Colon cancer Neg Hx    Esophageal cancer Neg Hx    Stomach cancer Neg Hx     Allergies  Allergen Reactions   Jardiance [Empagliflozin]     Metformin Diarrhea   Ozempic (0.25 Or 0.5 Mg-Dose) [Semaglutide(0.25 Or 0.5mg -Dos)] Nausea Only   Penicillins Hives and Itching   Prednisone     Elevates blood surgar, increased heart rate   Ertugliflozin Rash   Lovastatin Rash    Current Outpatient Medications on File Prior to Visit  Medication Sig Dispense Refill   Dapagliflozin-sAXagliptin (QTERN) 10-5 MG TABS Take 1 tablet by mouth daily in the afternoon. 30 tablet 11   Empagliflozin-linaGLIPtin (GLYXAMBI) 25-5 MG TABS Take 1 tablet by mouth daily. 90 tablet 3   famciclovir (FAMVIR) 500 MG tablet Take 1 tablet (500 mg total) by mouth 3 (three) times daily. 21 tablet 0   glipiZIDE (GLUCOTROL) 5 MG tablet Take 2 tablets (10 mg total) by mouth daily before breakfast AND 1 tablet (5 mg total) daily before supper. 270 tablet 3   levocetirizine (XYZAL) 5 MG tablet Take 1 tablet (5 mg total) by mouth every evening. 30 tablet 3   meloxicam (MOBIC) 7.5 MG tablet Take 1 tablet (7.5 mg total) by mouth 2 (two) times daily as needed. 45 tablet 1   omeprazole (PRILOSEC) 40 MG capsule Take 1 capsule (40 mg total) by mouth daily. 90 capsule 0   rosuvastatin (CRESTOR) 10 MG tablet Take 1 tablet (10 mg total) by mouth daily. 90 tablet 1   sodium polystyrene (KAYEXALATE) 15 GM/60ML suspension 60 ml twice daily for 3 days 360 mL 0   No current facility-administered medications on file prior to visit.    BP 126/64   Pulse (!) 58   Temp 98 F (36.7 C)   Resp 18   Ht 5\' 10"  (1.778 m)   Wt 197 lb 6.4 oz (89.5 kg)   SpO2 98%   BMI 28.32 kg/m        Objective:   Physical Exam  General Mental Status- Alert. General Appearance- Not in acute distress.   Skin General: Color- Normal Color. Moisture- Normal Moisture.  Neck Carotid Arteries- Normal color. Moisture- Normal Moisture. No carotid bruits. No JVD.  Chest and Lung Exam Auscultation: Breath Sounds:-Normal.  Cardiovascular Auscultation:Rythm- Regular. Murmurs & Other Heart  Sounds:Auscultation of the heart reveals- No Murmurs.  Abdomen Inspection:-Inspeection Normal. Palpation/Percussion:Note:No mass. Palpation and Percussion of the abdomen reveal- Non Tender, Non Distended + BS, no rebound or guarding.    Neurologic Cranial Nerve exam:- CN III-XII intact(No nystagmus), symmetric smile. Drift Test:- No drift. Romberg Exam:- Negative.  Heal to Toe Gait exam:-Normal. Finger to Nose:- Normal/Intact Strength:- 5/5 equal and symmetric strength both upper and lower extremities.       Assessment & Plan:   Patient Instructions  Type 2 diabetes- currently on farxiga  and glipizide. Lucina Mellow was not covered.  High cholesterol- continue crestor.  Gerd- controlled. Continue omeprazole.  Neck pain. Likely muscular as your c spine xray last year was normal. Rx meloxicam. Can use limited flexeril at night if needed.  Allergic rhinitis controlled with xyzal. Can add on flonase nasal spray if need and saline nasal spray 12 hours apart.  Follow up 6 months or sooner if needed.      Esperanza Richters, PA-C

## 2022-04-27 NOTE — Patient Instructions (Addendum)
Type 2 diabetes- currently on farxiga and glipizide. Abigail Miyamoto was not covered.  High cholesterol- continue crestor.  Gerd- controlled. Continue omeprazole.  Neck pain. Likely muscular as your c spine xray last year was normal. Rx meloxicam. Can use limited flexeril at night if needed.  Allergic rhinitis controlled with xyzal. Can add on flonase nasal spray if need and saline nasal spray 12 hours apart.  Follow up 6 months or sooner if needed.

## 2022-05-04 MED ORDER — OMEPRAZOLE 40 MG PO CPDR: 40.0000 mg | DELAYED_RELEASE_CAPSULE | Freq: Every day | ORAL | 0 refills | Status: DC

## 2022-05-04 MED ORDER — GLIPIZIDE 5 MG PO TABS: ORAL_TABLET | ORAL | 3 refills | Status: DC

## 2022-05-04 MED ORDER — LEVOCETIRIZINE DIHYDROCHLORIDE 5 MG PO TABS: 5.0000 mg | ORAL_TABLET | Freq: Every evening | ORAL | 3 refills | Status: DC

## 2022-05-04 NOTE — Addendum Note (Signed)
Addended by: Jeronimo Greaves on: 05/04/2022 03:35 PM   Modules accepted: Orders

## 2022-05-08 ENCOUNTER — Encounter: Payer: Self-pay | Admitting: Medical

## 2022-05-28 ENCOUNTER — Other Ambulatory Visit: Payer: Self-pay | Admitting: Internal Medicine

## 2022-07-17 ENCOUNTER — Ambulatory Visit: Payer: BC Managed Care – PPO | Admitting: Internal Medicine

## 2022-08-24 ENCOUNTER — Other Ambulatory Visit: Payer: Self-pay | Admitting: Medical

## 2022-08-24 MED ORDER — OMEPRAZOLE 40 MG PO CPDR: 40.0000 mg | DELAYED_RELEASE_CAPSULE | Freq: Every day | ORAL | 0 refills | Status: DC

## 2022-09-17 ENCOUNTER — Ambulatory Visit: Payer: Self-pay

## 2022-09-17 ENCOUNTER — Encounter: Payer: Self-pay | Admitting: Family Medicine

## 2022-09-17 ENCOUNTER — Ambulatory Visit (INDEPENDENT_AMBULATORY_CARE_PROVIDER_SITE_OTHER): Payer: BC Managed Care – PPO | Admitting: Family Medicine

## 2022-09-17 VITALS — BP 126/82 | Ht 70.0 in | Wt 200.0 lb

## 2022-09-17 DIAGNOSIS — M778 Other enthesopathies, not elsewhere classified: Secondary | ICD-10-CM | POA: Diagnosis not present

## 2022-09-17 MED ORDER — TRIAMCINOLONE ACETONIDE 40 MG/ML IJ SUSP
40.0000 mg | Freq: Once | INTRAMUSCULAR | Status: AC
  Administered 2022-09-17: 40 mg via INTRA_ARTICULAR

## 2022-09-17 NOTE — Assessment & Plan Note (Signed)
Acutely occurring.  Having limitations in external rotation as well as weakness around the joint.  No specific injury.  He does do repetitive activity with working Architect. -Counseled on home exercise therapy and supportive care. -Injection today. -Counseled on compression. -Could consider physical therapy or further imaging

## 2022-09-17 NOTE — Patient Instructions (Signed)
Good to see you Please try heat before exercise and ice after  Please try the exercises  You can consider the compression   Please send me a message in MyChart with any questions or updates.  Please see me back in 4 weeks.   --Dr. Raeford Razor

## 2022-09-17 NOTE — Progress Notes (Signed)
  Dionis Spar - 57 y.o. male MRN [id number]  Date of birth: 04-Jun-1966  SUBJECTIVE:  Including CC & ROS.  No chief complaint on file.   Brian Pittman is a 57 y.o. male that is presenting with acute right shoulder pain.  He is having limited range of motion and the pain is affecting his ability to work.  He has trouble lifting up and has weakness.    Review of Systems See HPI   HISTORY: Past Medical, Surgical, Social, and Family History Reviewed & Updated per EMR.   Pertinent Historical Findings include:  Past Medical History:  Diagnosis Date   Body mass index (BMI) 31.0-31.9, adult    DM2 (diabetes mellitus, type 2) (HCC)    Ear infection    Generalized anxiety disorder 06/28/2018   GERD (gastroesophageal reflux disease)    Hypercholesterolemia    Panic disorder (episodic paroxysmal anxiety) 09/14/2016   Pneumonia    Sleep apnea    Trochanteric bursitis of right hip 05/17/2018    Past Surgical History:  Procedure Laterality Date   CHOLECYSTECTOMY     COLONOSCOPY     Dr Orlena Sheldon around 2016 said it was normal     PHYSICAL EXAM:  VS: BP 126/82   Ht 5' 10"$  (1.778 m)   Wt 200 lb (90.7 kg)   BMI 28.70 kg/m  Physical Exam Gen: NAD, alert, cooperative with exam, well-appearing MSK:  Neurovascularly intact     Aspiration/Injection Procedure Note Daniels Thoma 1965/09/29  Procedure: Injection Indications: Right shoulder pain  Procedure Details Consent: Risks of procedure as well as the alternatives and risks of each were explained to the (patient/caregiver).  Consent for procedure obtained. Time Out: Verified patient identification, verified procedure, site/side was marked, verified correct patient position, special equipment/implants available, medications/allergies/relevent history reviewed, required imaging and test results available.  Performed.  The area was cleaned with iodine and alcohol swabs.    The right glenohumeral joint was injected using 4 cc of 1% lidocaine  and 4 cc of 8.4% sodium bicarbonate on a 22-gauge 3-1/2 inch needle.  The syringe was switched to mixture containing 1 cc's of 40 mg Kenalog and 4 cc's of 0.25% bupivacaine was injected.  Ultrasound was used. Images were obtained in short views showing the injection.     A sterile dressing was applied.  Patient did tolerate procedure well.     ASSESSMENT & PLAN:   Capsulitis of right shoulder Acutely occurring.  Having limitations in external rotation as well as weakness around the joint.  No specific injury.  He does do repetitive activity with working Architect. -Counseled on home exercise therapy and supportive care. -Injection today. -Counseled on compression. -Could consider physical therapy or further imaging

## 2022-10-02 DIAGNOSIS — J019 Acute sinusitis, unspecified: Secondary | ICD-10-CM | POA: Diagnosis not present

## 2022-10-02 DIAGNOSIS — Z20822 Contact with and (suspected) exposure to covid-19: Secondary | ICD-10-CM | POA: Diagnosis not present

## 2022-10-02 DIAGNOSIS — E119 Type 2 diabetes mellitus without complications: Secondary | ICD-10-CM | POA: Diagnosis not present

## 2022-10-15 ENCOUNTER — Ambulatory Visit: Payer: BC Managed Care – PPO | Admitting: Family Medicine

## 2022-10-22 NOTE — Progress Notes (Unsigned)
Name: Brian Pittman  Age/ Sex: 57 y.o., male   MRN/ DOB: [id number], 08-27-1965     PCP: [name]   Reason for Endocrinology Evaluation: Type 2 Diabetes Mellitus  Initial Endocrine Consultative Visit: 02/14/2020    PATIENT IDENTIFIER: [name] is a 57 y.o. male with a past medical history of T2DM and Dyslipidemia. The patient has followed with Endocrinology clinic since 02/14/2020 for consultative assistance with management of his diabetes.  DIABETIC HISTORY:  Mr. Mcclain was diagnosed with DM in 2015,metformin - GI side effects . Ozempic/Trulicity - sour stomach and gas . His hemoglobin A1c has ranged from 7.1% in 08/2019, peaking at 7.5% in 03/2020.  On his initial to our clinic he had an A1c of 7.1 %. He was on Amaryl and Qtern. We switched Glimepiride to Glipizide and continued Qtern but by 07/2020 [name] became cost prohibitve and started [place]   SUBJECTIVE:   During the last visit (01/29/2022): A1c 6.9 %   Today (10/23/2022): [name] is here for a follow up on diabetes. [name]  He checks his blood sugars 2 times daily. The patient has not had hypoglycemic episodes since the last clinic visit.   He follows with sports medicine for right shoulder capsulitis Has occasional burning and feeling of pin needles in the morning   He required prednisone in March, resulting in hyperglycemia in the low 200's mg/dL   Denies dizziness , unless while on cough syrup  Denies nausea, vomiting or diarrhea     HOME DIABETES REGIMEN:  Glipizide 5 mg 2 tabs before breakfast and 1 tabs before supper Farxiga 10 mg daily      Statin: Yes ACE-I/ARB: no   GLUCOSE LOG:  116-203  mg/dL     DIABETIC COMPLICATIONS: Microvascular complications:   Denies: CKD, neuropathy, retinopathy  Last Eye Exam: Completed 2022 Dr. Hazle Quant   Macrovascular complications:   Denies: CAD, CVA, PVD   HISTORY:  Past Medical History:  Past Medical History:  Diagnosis Date   Body mass index  (BMI) 31.0-31.9, adult    DM2 (diabetes mellitus, type 2)    Ear infection    Generalized anxiety disorder 06/28/2018   GERD (gastroesophageal reflux disease)    Hypercholesterolemia    Panic disorder (episodic paroxysmal anxiety) 09/14/2016   Pneumonia    Sleep apnea    Trochanteric bursitis of right hip 05/17/2018   Past Surgical History:  Past Surgical History:  Procedure Laterality Date   CHOLECYSTECTOMY     COLONOSCOPY     Dr Rayfield Citizen around 2016 said it was normal   Social History:  reports that he has never smoked. He quit smokeless tobacco use about 4 years ago.  His smokeless tobacco use included snuff. He reports current alcohol use. He reports that he does not use drugs. Family History:  Family History  Problem Relation Age of Onset   Diabetes Mother    Diabetes Father    Leukemia Father    Skin cancer Father    Heart attack Father    Atrial fibrillation Father    Lung cancer Maternal Grandfather    Heart attack Paternal Grandmother    Leukemia Paternal Grandmother    Stroke Paternal Grandfather    Leukemia Paternal Grandfather    GER disease Sister    Arthritis Sister    Colon cancer Neg Hx    Esophageal cancer Neg Hx    Stomach cancer Neg Hx      HOME MEDICATIONS: Allergies as of 10/23/2022  Reactions   Jardiance [empagliflozin]    Metformin Diarrhea   Ozempic (0.25 Or 0.5 Mg-dose) [semaglutide(0.25 Or 0.5mg -dos)] Nausea Only   Penicillins Hives, Itching   Prednisone    Elevates blood surgar, increased heart rate   Ertugliflozin Rash   Lovastatin Rash        Medication List        Accurate as of October 23, 2022 10:59 AM. If you have any questions, ask your nurse or doctor.          STOP taking these medications    cyclobenzaprine 10 MG tablet Commonly known as: FLEXERIL Stopped by: Scarlette Shorts, MD       TAKE these medications    famciclovir 500 MG tablet Commonly known as: FAMVIR Take 1 tablet (500 mg total) by mouth  3 (three) times daily.   Farxiga 10 MG Tabs tablet Generic drug: dapagliflozin propanediol TAKE 1 TABLET BY MOUTH DAILY   glipiZIDE 5 MG tablet Commonly known as: GLUCOTROL Take 2 tablets (10 mg total) by mouth daily before breakfast AND 1 tablet (5 mg total) daily before supper.   Glyxambi 25-5 MG Tabs Generic drug: Empagliflozin-linaGLIPtin Take 1 tablet by mouth daily.   levocetirizine 5 MG tablet Commonly known as: XYZAL Take 1 tablet (5 mg total) by mouth every evening.   meloxicam 7.5 MG tablet Commonly known as: Mobic Take 1 tablet (7.5 mg total) by mouth 2 (two) times daily as needed. What changed: Another medication with the same name was removed. Continue taking this medication, and follow the directions you see here. Changed by: Scarlette Shorts, MD   omeprazole 40 MG capsule Commonly known as: PRILOSEC Take 1 capsule (40 mg total) by mouth daily.   Qtern 10-5 MG Tabs Generic drug: Dapagliflozin-sAXagliptin Take 1 tablet by mouth daily in the afternoon.   rosuvastatin 10 MG tablet Commonly known as: CRESTOR Take 1 tablet (10 mg total) by mouth daily.   sodium polystyrene 15 GM/60ML suspension Commonly known as: KAYEXALATE 60 ml twice daily for 3 days         OBJECTIVE:   Vital Signs: BP 122/80   Pulse 86   Ht 5\' 10"  (1.778 m)   Wt 193 lb (87.5 kg)   SpO2 97%   BMI 27.69 kg/m   Wt Readings from Last 3 Encounters:  10/23/22 193 lb (87.5 kg)  09/17/22 200 lb (90.7 kg)  04/27/22 197 lb 6.4 oz (89.5 kg)     Exam: General: Pt appears well and is in NAD  Lungs: Clear with good BS bilat with no rales, rhonchi, or wheezes  Heart: RRR   Abdomen: Normoactive bowel sounds, soft, nontender, without masses or organomegaly palpable  Extremities: No pretibial edema.   Neuro: MS is good with appropriate affect, pt is alert and Ox3     DM foot exam: 01/29/2022   The skin of the feet is intact without sores or ulcerations. The pedal pulses are 1+  on right and 1 + on left. The sensation is intact to a screening 5.07, 10 gram monofilament bilaterally      DATA REVIEWED:  Lab Results  Component Value Date   HGBA1C 8.3 (A) 10/23/2022   HGBA1C 6.9 (A) 01/29/2022   HGBA1C 8.5 (H) 10/29/2021  **** ASSESSMENT / PLAN / RECOMMENDATIONS:   1) Type 2 Diabetes Mellitus, Poorly Controlled, Without complications - Most recent A1c of 8.3  %. Goal A1c < 7.0 %.    -A1c has trended up from 6.9%  to 8.3% -The patient had glucocorticoids over the past month due to either URI and shoulder  capsulitis, he also admits to dietary indiscretion when he was not feeling very well -We will increase glipizide during suppertime as below -Patient is motivated to increase physical activity and follow a low-carb diet - Intolerant to Metformin and GLP-1 agonists      MEDICATIONS: -   Change glipizide 5 mg, 2 tablets before Breakfast and continue 1.5 tablets before supper  -Continue Farxiga 10 mg daily   EDUCATION / INSTRUCTIONS: BG monitoring instructions: Patient is instructed to check his blood sugars 1 times a day Call [facility] if: BG persistently < 70  I reviewed the Rule of 15 for the treatment of hypoglycemia in detail with the patient. Literature supplied.    2) Diabetic complications:  Eye: Does not have known diabetic retinopathy.  Neuro/ Feet: Does not have known diabetic peripheral neuropathy .  Renal: Patient does not have known baseline CKD. He   Is not  on an ACEI/ARB at present.      F/U in 6 months    Signed electronically by: Lyndle Herrlich, MD  [facility]  [facility] [address] [place], [place] [address] Phone: [phone] FAX: [phone]   CC: [name] [address] [place] [address] Phone: [phone]  Fax: [phone]  Return to Endocrinology clinic as below: Future Appointments  Date Time Provider  Department Center  11/04/2022  4:10 PM Myra Rude, MD [facility]

## 2022-10-23 ENCOUNTER — Ambulatory Visit (INDEPENDENT_AMBULATORY_CARE_PROVIDER_SITE_OTHER): Payer: BC Managed Care – PPO | Admitting: Internal Medicine

## 2022-10-23 ENCOUNTER — Encounter: Payer: Self-pay | Admitting: Internal Medicine

## 2022-10-23 VITALS — BP 122/80 | HR 86 | Ht 70.0 in | Wt 193.0 lb

## 2022-10-23 DIAGNOSIS — E785 Hyperlipidemia, unspecified: Secondary | ICD-10-CM | POA: Diagnosis not present

## 2022-10-23 DIAGNOSIS — E1165 Type 2 diabetes mellitus with hyperglycemia: Secondary | ICD-10-CM

## 2022-10-23 DIAGNOSIS — E119 Type 2 diabetes mellitus without complications: Secondary | ICD-10-CM

## 2022-10-23 LAB — POCT GLYCOSYLATED HEMOGLOBIN (HGB A1C): Hemoglobin A1C: 8.3 % — AB (ref 4.0–5.6)

## 2022-10-23 LAB — LIPID PANEL
Cholesterol: 115 mg/dL (ref 0–200)
HDL: 46.6 mg/dL (ref >39.00)
LDL Cholesterol: 56 mg/dL (ref 0–99)
NonHDL: 68.15
Total CHOL/HDL Ratio: 2
Triglycerides: 59 mg/dL (ref 0.0–149.0)
VLDL: 11.8 mg/dL (ref 0.0–40.0)

## 2022-10-23 LAB — BASIC METABOLIC PANEL
BUN: 18 mg/dL (ref 6–23)
CO2: 27 mEq/L (ref 19–32)
Calcium: 9.9 mg/dL (ref 8.4–10.5)
Chloride: 102 mEq/L (ref 96–112)
Creatinine, Ser: 0.69 mg/dL (ref 0.40–1.50)
GFR: 103.83 mL/min (ref >60.00)
Glucose, Bld: 109 mg/dL — ABNORMAL HIGH (ref 70–99)
Potassium: 4 mEq/L (ref 3.5–5.1)
Sodium: 137 mEq/L (ref 135–145)

## 2022-10-23 LAB — MICROALBUMIN / CREATININE URINE RATIO
Creatinine,U: 48 mg/dL
Microalb Creat Ratio: 1.5 mg/g (ref 0.0–30.0)
Microalb, Ur: <0.7 mg/dL (ref 0.0–1.9)

## 2022-10-23 MED ORDER — DAPAGLIFLOZIN PROPANEDIOL 10 MG PO TABS: 10.0000 mg | ORAL_TABLET | Freq: Every day | ORAL | 11 refills | Status: DC

## 2022-10-23 MED ORDER — GLIPIZIDE 5 MG PO TABS: ORAL_TABLET | ORAL | 3 refills | Status: DC

## 2022-10-23 NOTE — Patient Instructions (Signed)
-   Continue  farxiga 10 mg , 1 tablet daily  - Change Glipizide 5 mg, 2 tablets before Breakfast and 1.5 tablets before supper       HOW TO TREAT LOW BLOOD SUGARS (Blood sugar LESS THAN 70 MG/DL) Please follow the RULE OF 15 for the treatment of hypoglycemia treatment (when your (blood sugars are less than 70 mg/dL)   STEP 1: Take 15 grams of carbohydrates when your blood sugar is low, which includes:  3-4 GLUCOSE TABS  OR 3-4 OZ OF JUICE OR REGULAR SODA OR ONE TUBE OF GLUCOSE GEL    STEP 2: RECHECK blood sugar in 15 MINUTES STEP 3: If your blood sugar is still low at the 15 minute recheck --> then, go back to STEP 1 and treat AGAIN with another 15 grams of carbohydrates.

## 2022-10-29 ENCOUNTER — Encounter: Payer: Self-pay | Admitting: Medical

## 2022-10-29 MED ORDER — LEVOCETIRIZINE DIHYDROCHLORIDE 5 MG PO TABS: 5.0000 mg | ORAL_TABLET | Freq: Every evening | ORAL | 0 refills | Status: DC

## 2022-10-29 MED ORDER — ROSUVASTATIN CALCIUM 10 MG PO TABS: 10.0000 mg | ORAL_TABLET | Freq: Every day | ORAL | 0 refills | Status: DC

## 2022-11-02 ENCOUNTER — Encounter: Payer: Self-pay | Admitting: *Deleted

## 2022-11-04 ENCOUNTER — Ambulatory Visit: Payer: BC Managed Care – PPO | Admitting: Family Medicine

## 2022-11-19 ENCOUNTER — Ambulatory Visit: Payer: BC Managed Care – PPO | Admitting: Family Medicine

## 2022-11-20 ENCOUNTER — Other Ambulatory Visit: Payer: Self-pay | Admitting: Medical

## 2022-12-15 ENCOUNTER — Other Ambulatory Visit: Payer: Self-pay

## 2022-12-15 ENCOUNTER — Ambulatory Visit (INDEPENDENT_AMBULATORY_CARE_PROVIDER_SITE_OTHER): Payer: BC Managed Care – PPO | Admitting: Family Medicine

## 2022-12-15 ENCOUNTER — Encounter: Payer: Self-pay | Admitting: Family Medicine

## 2022-12-15 VITALS — BP 128/80 | Ht 70.0 in | Wt 193.0 lb

## 2022-12-15 DIAGNOSIS — M778 Other enthesopathies, not elsewhere classified: Secondary | ICD-10-CM

## 2022-12-15 MED ORDER — TRIAMCINOLONE ACETONIDE 40 MG/ML IJ SUSP
40.0000 mg | Freq: Once | INTRAMUSCULAR | Status: AC
Start: 2022-12-15 — End: 2022-12-15
  Administered 2022-12-15: 40 mg via INTRA_ARTICULAR

## 2022-12-15 NOTE — Progress Notes (Signed)
PCP: Esperanza Richters, PA-C  Subjective:   HPI: Patient is a 57 y.o. male here for right shoulder pain.  Patient was seen about 3 months ago for same issue. Injection (glenohumeral) helped some but only temporarily. Has been doing home exercises (codman). Unable to get comfortable. + night pain Worse with overhead motion like at work and had to adjust how he lifts things Taking ibuprofen. No numbness or tingling, just burning in shoulder.  Past Medical History:  Diagnosis Date   Body mass index (BMI) 31.0-31.9, adult    DM2 (diabetes mellitus, type 2) (HCC)    Ear infection    Generalized anxiety disorder 06/28/2018   GERD (gastroesophageal reflux disease)    Hypercholesterolemia    Panic disorder (episodic paroxysmal anxiety) 09/14/2016   Pneumonia    Sleep apnea    Trochanteric bursitis of right hip 05/17/2018    Current Outpatient Medications on File Prior to Visit  Medication Sig Dispense Refill   dapagliflozin propanediol (FARXIGA) 10 MG TABS tablet Take 1 tablet (10 mg total) by mouth daily. 30 tablet 11   famciclovir (FAMVIR) 500 MG tablet Take 1 tablet (500 mg total) by mouth 3 (three) times daily. 21 tablet 0   glipiZIDE (GLUCOTROL) 5 MG tablet Take 2 tablets (10 mg total) by mouth daily before breakfast AND 1.5 tablets (7.5 mg total) daily before supper. 315 tablet 3   levocetirizine (XYZAL) 5 MG tablet Take 1 tablet (5 mg total) by mouth every evening. 90 tablet 0   meloxicam (MOBIC) 7.5 MG tablet Take 1 tablet (7.5 mg total) by mouth 2 (two) times daily as needed. (Patient not taking: Reported on 10/23/2022) 45 tablet 1   omeprazole (PRILOSEC) 40 MG capsule TAKE ONE CAPSULE BY MOUTH DAILY 90 capsule 0   rosuvastatin (CRESTOR) 10 MG tablet Take 1 tablet (10 mg total) by mouth daily. 90 tablet 0   sodium polystyrene (KAYEXALATE) 15 GM/60ML suspension 60 ml twice daily for 3 days 360 mL 0   No current facility-administered medications on file prior to visit.    Past  Surgical History:  Procedure Laterality Date   CHOLECYSTECTOMY     COLONOSCOPY     Dr Rayfield Citizen around 2016 said it was normal    Allergies  Allergen Reactions   Jardiance [Empagliflozin]    Metformin Diarrhea   Ozempic (0.25 Or 0.5 Mg-Dose) [Semaglutide(0.25 Or 0.5mg -Dos)] Nausea Only   Penicillins Hives and Itching   Prednisone     Elevates blood surgar, increased heart rate   Ertugliflozin Rash   Lovastatin Rash    BP 128/80 (BP Location: Left Arm, Patient Position: Sitting)   Ht 5\' 10"  (1.778 m)   Wt 193 lb (87.5 kg)   BMI 27.69 kg/m       No data to display              No data to display              Objective:  Physical Exam:  Gen: NAD, comfortable in exam room  Right shoulder: No swelling, ecchymoses.  No gross deformity. No TTP AC joint, biceps tendon. ROM equal compared to left with painful arc. Positive Hawkins, Neers. Negative Yergasons. Strength 5/5 with empty can and resisted internal/external rotation.  Pain empty can > external rotation NV intact distally.   Assessment & Plan:  1. Right shoulder pain - adhesive capsulitis improved and motion similar to left shoulder now.  Exam consistent with impingement and limited msk u/s showed subacromial bursitis.  Discussed options.  Subacromial injection given today.  Continue home exercises - add theraband strengthening.  Consider MRI if not improving.  After informed written consent timeout was performed, patient was seated in chair in exam room. Right shoulder was prepped with alcohol swab and utilizing lateral approach with ultrasound guidance, patient's right subacromial space was injected with 3:1 bupivicaine: kenalog. Patient tolerated the procedure well without immediate complications.

## 2022-12-15 NOTE — Patient Instructions (Signed)
You have rotator cuff impingement Try to avoid painful activities (overhead activities, lifting with extended arm) as much as possible. Aleve 2 tabs twice a day with food OR ibuprofen 3 tabs three times a day with food for pain and inflammation as needed. Can take tylenol in addition to this. Subacromial injection may be beneficial to help with pain and to decrease inflammation - you were given this today. Consider physical therapy with transition to home exercise program. Do home exercise program with theraband and scapular stabilization exercises daily 3 sets of 10 once a day. If not improving at follow-up we will consider MRI. Follow up with me in 1 month but call me sooner if you're struggling.

## 2023-01-14 ENCOUNTER — Ambulatory Visit: Payer: BC Managed Care – PPO | Admitting: Family Medicine

## 2023-01-14 ENCOUNTER — Ambulatory Visit: Payer: BC Managed Care – PPO | Admitting: Sports Medicine

## 2023-01-26 ENCOUNTER — Other Ambulatory Visit: Payer: Self-pay | Admitting: Medical

## 2023-02-01 ENCOUNTER — Encounter: Payer: Self-pay | Admitting: Family Medicine

## 2023-02-01 ENCOUNTER — Ambulatory Visit (INDEPENDENT_AMBULATORY_CARE_PROVIDER_SITE_OTHER): Payer: BC Managed Care – PPO | Admitting: Family Medicine

## 2023-02-01 VITALS — BP 130/82 | Ht 70.0 in | Wt 196.0 lb

## 2023-02-01 DIAGNOSIS — M778 Other enthesopathies, not elsewhere classified: Secondary | ICD-10-CM | POA: Diagnosis not present

## 2023-02-01 MED ORDER — DICLOFENAC SODIUM 75 MG PO TBEC: 75.0000 mg | DELAYED_RELEASE_TABLET | Freq: Two times a day (BID) | ORAL | 1 refills | Status: AC

## 2023-02-01 NOTE — Progress Notes (Signed)
PCP: Esperanza Richters, PA-C  Subjective:   HPI: Patient is a 57 y.o. male here for follow-up for right shoulder pain 2/2 adhesive capsulitis, impingement, subacromial bursitis.  Last seen 12/15/2022, received subacromial injection and encouraged to continue home exercises with theraband strengthening  Pain improved temporarily for about a month after his shot but has since returned.  Still works in [employer] and having to use his arms.  Pain wakes him up at night.  Difficult to sleep on the right side.  Also has some tightness in his neck as a result of this pain/stiffness.  Denies shooting pain down his arm, denies numbness/tingling.  No recent injury.  Has been trying NSAIDs at home exercises without much improvement.  Past Medical History:  Diagnosis Date   Body mass index (BMI) 31.0-31.9, adult    DM2 (diabetes mellitus, type 2) (HCC)    Ear infection    Generalized anxiety disorder 06/28/2018   GERD (gastroesophageal reflux disease)    Hypercholesterolemia    Panic disorder (episodic paroxysmal anxiety) 09/14/2016   Pneumonia    Sleep apnea    Trochanteric bursitis of right hip 05/17/2018    Current Outpatient Medications on File Prior to Visit  Medication Sig Dispense Refill   dapagliflozin propanediol (FARXIGA) 10 MG TABS tablet Take 1 tablet (10 mg total) by mouth daily. 30 tablet 11   famciclovir (FAMVIR) 500 MG tablet Take 1 tablet (500 mg total) by mouth 3 (three) times daily. 21 tablet 0   glipiZIDE (GLUCOTROL) 5 MG tablet Take 2 tablets (10 mg total) by mouth daily before breakfast AND 1.5 tablets (7.5 mg total) daily before supper. 315 tablet 3   levocetirizine (XYZAL) 5 MG tablet Take 1 tablet (5 mg total) by mouth every evening. 90 tablet 0   meloxicam (MOBIC) 7.5 MG tablet Take 1 tablet (7.5 mg total) by mouth 2 (two) times daily as needed. (Patient not taking: Reported on 10/23/2022) 45 tablet 1   omeprazole (PRILOSEC) 40 MG capsule TAKE ONE CAPSULE BY MOUTH DAILY 90  capsule 0   rosuvastatin (CRESTOR) 10 MG tablet Take 1 tablet (10 mg total) by mouth daily. 30 tablet 0   sodium polystyrene (KAYEXALATE) 15 GM/60ML suspension 60 ml twice daily for 3 days 360 mL 0   No current facility-administered medications on file prior to visit.    Past Surgical History:  Procedure Laterality Date   CHOLECYSTECTOMY     COLONOSCOPY     Dr Rayfield Citizen around 2016 said it was normal    Allergies  Allergen Reactions   Jardiance [Empagliflozin]    Metformin Diarrhea   Ozempic (0.25 Or 0.5 Mg-Dose) [Semaglutide(0.25 Or 0.5mg -Dos)] Nausea Only   Penicillins Hives and Itching   Prednisone     Elevates blood surgar, increased heart rate   Ertugliflozin Rash   Lovastatin Rash    There were no vitals taken for this visit.      No data to display              No data to display              Objective:  Physical Exam:  Gen: NAD, comfortable in exam room  R shoulder: Inspection: No gross deformity, ecchymosis, swelling Palpation: Nontender to palpation along clavicle, AC joint, scapula ROM: Abduction limited to about 80 degrees, external rotation limited to about 40 degrees (although his left shoulder has similar external rotation without pain) Strength: 5-/5 resistance to external rotation and abduction.  5/5 strength otherwise Special  tests: Positive Hawkins, positive Neer, positive empty can, negative Yergason   Assessment & Plan:  1. R shoulder pain 2/2 adhesive capsulitis with likely impingement, ?rotator cuff injury. No resolution of symptoms after subacromial injection and glenohumeral injection, NSAID use, home exercises. Low suspicion for radiculopathy. Will obtain MRI to further evaluate. Also encouraged diclofenac use and continued conservative management with home exercises and heating/icing.

## 2023-02-01 NOTE — Patient Instructions (Signed)
We will go ahead with an MRI of your shoulder given you're not improving with exercises, anti-inflammatories, 2 injections. Try diclofenac 75mg  twice a day with food for pain and inflammation. Don't take ibuprofen while on this. Heat or ice if needed. Continue home exercises in meantime. I'll call you with results and next steps.

## 2023-02-11 ENCOUNTER — Telehealth: Payer: Self-pay | Admitting: *Deleted

## 2023-02-11 ENCOUNTER — Ambulatory Visit (HOSPITAL_BASED_OUTPATIENT_CLINIC_OR_DEPARTMENT_OTHER)
Admission: RE | Admit: 2023-02-11 | Discharge: 2023-02-11 | Disposition: A | Discharge status: Home / Self Care | Payer: BC Managed Care – PPO | Source: Ambulatory Visit | Attending: Family Medicine | Admitting: Family Medicine

## 2023-02-11 DIAGNOSIS — M25511 Pain in right shoulder: Secondary | ICD-10-CM | POA: Diagnosis not present

## 2023-02-11 DIAGNOSIS — M19011 Primary osteoarthritis, right shoulder: Secondary | ICD-10-CM | POA: Diagnosis not present

## 2023-02-11 DIAGNOSIS — M778 Other enthesopathies, not elsewhere classified: Secondary | ICD-10-CM | POA: Diagnosis not present

## 2023-02-11 NOTE — Telephone Encounter (Signed)
-----   Message from [facility] sent at 02/09/2023  3:32 PM EDT ----- Regarding: xrays So Brian Pittman needs X-rays of his shoulder before insurance will approve him having an MRI.  Can you call him and arrange for him to get these?  Then I can do the peer to peer.  Thanks!

## 2023-02-11 NOTE — Telephone Encounter (Signed)
Pt informed of below. He prefers to come to [facility]. Shoulder xray order placed.

## 2023-02-13 ENCOUNTER — Other Ambulatory Visit: Payer: BC Managed Care – PPO

## 2023-02-18 ENCOUNTER — Other Ambulatory Visit: Payer: Self-pay | Admitting: Medical

## 2023-02-19 NOTE — Telephone Encounter (Signed)
Megan sent Xrays to [facility]. Dr. Pearletha Forge call for peer to peer for MRI for right shoulder.

## 2023-02-24 ENCOUNTER — Telehealth: Payer: Self-pay | Admitting: Medical

## 2023-02-24 ENCOUNTER — Other Ambulatory Visit: Payer: Self-pay

## 2023-02-24 DIAGNOSIS — M778 Other enthesopathies, not elsewhere classified: Secondary | ICD-10-CM

## 2023-02-24 MED ORDER — OMEPRAZOLE 40 MG PO CPDR: 40.0000 mg | DELAYED_RELEASE_CAPSULE | Freq: Every day | ORAL | 0 refills | Status: DC

## 2023-02-24 NOTE — Telephone Encounter (Signed)
Rx sent 

## 2023-02-24 NOTE — Telephone Encounter (Signed)
Pt needs refill on omeprazole. Please send to [facility]. Pt is scheduled to have physical 03/17/23.

## 2023-02-24 NOTE — Addendum Note (Signed)
Addended by: Maximino Sarin on: 02/24/2023 10:22 AM   Modules accepted: Orders

## 2023-02-25 ENCOUNTER — Other Ambulatory Visit: Payer: Self-pay | Admitting: Medical

## 2023-02-26 DIAGNOSIS — S46011A Strain of muscle(s) and tendon(s) of the rotator cuff of right shoulder, initial encounter: Secondary | ICD-10-CM | POA: Diagnosis not present

## 2023-03-06 DIAGNOSIS — M25511 Pain in right shoulder: Secondary | ICD-10-CM | POA: Diagnosis not present

## 2023-03-06 DIAGNOSIS — M7581 Other shoulder lesions, right shoulder: Secondary | ICD-10-CM | POA: Diagnosis not present

## 2023-03-06 DIAGNOSIS — M75111 Incomplete rotator cuff tear or rupture of right shoulder, not specified as traumatic: Secondary | ICD-10-CM | POA: Diagnosis not present

## 2023-03-06 DIAGNOSIS — S46811A Strain of other muscles, fascia and tendons at shoulder and upper arm level, right arm, initial encounter: Secondary | ICD-10-CM | POA: Diagnosis not present

## 2023-03-06 DIAGNOSIS — M129 Arthropathy, unspecified: Secondary | ICD-10-CM | POA: Diagnosis not present

## 2023-03-13 ENCOUNTER — Emergency Department (HOSPITAL_BASED_OUTPATIENT_CLINIC_OR_DEPARTMENT_OTHER): Payer: BC Managed Care – PPO

## 2023-03-13 ENCOUNTER — Encounter (HOSPITAL_BASED_OUTPATIENT_CLINIC_OR_DEPARTMENT_OTHER): Payer: Self-pay

## 2023-03-13 ENCOUNTER — Emergency Department (HOSPITAL_BASED_OUTPATIENT_CLINIC_OR_DEPARTMENT_OTHER)
Admission: EM | Admit: 2023-03-13 | Discharge: 2023-03-13 | Disposition: A | Discharge status: Home / Self Care | Payer: BC Managed Care – PPO | Attending: Emergency Medicine | Admitting: Emergency Medicine

## 2023-03-13 ENCOUNTER — Other Ambulatory Visit: Payer: Self-pay

## 2023-03-13 DIAGNOSIS — R197 Diarrhea, unspecified: Secondary | ICD-10-CM | POA: Diagnosis not present

## 2023-03-13 DIAGNOSIS — N200 Calculus of kidney: Secondary | ICD-10-CM | POA: Diagnosis not present

## 2023-03-13 DIAGNOSIS — R1031 Right lower quadrant pain: Secondary | ICD-10-CM | POA: Diagnosis not present

## 2023-03-13 DIAGNOSIS — Z7984 Long term (current) use of oral hypoglycemic drugs: Secondary | ICD-10-CM | POA: Diagnosis not present

## 2023-03-13 DIAGNOSIS — E119 Type 2 diabetes mellitus without complications: Secondary | ICD-10-CM | POA: Insufficient documentation

## 2023-03-13 LAB — URINALYSIS, MICROSCOPIC (REFLEX)
RBC / HPF: NONE SEEN RBC/hpf (ref 0–5)
WBC, UA: NONE SEEN WBC/hpf (ref 0–5)

## 2023-03-13 LAB — CBC WITH DIFFERENTIAL/PLATELET
Abs Immature Granulocytes: 0.04 10*3/uL (ref 0.00–0.07)
Basophils Absolute: 0.1 10*3/uL (ref 0.0–0.1)
Basophils Relative: 1 %
Eosinophils Absolute: 0.2 10*3/uL (ref 0.0–0.5)
Eosinophils Relative: 2 %
HCT: 45.4 % (ref 39.0–52.0)
Hemoglobin: 15.7 g/dL (ref 13.0–17.0)
Immature Granulocytes: 1 %
Lymphocytes Relative: 28 %
Lymphs Abs: 2 10*3/uL (ref 0.7–4.0)
MCH: 30.4 pg (ref 26.0–34.0)
MCHC: 34.6 g/dL (ref 30.0–36.0)
MCV: 87.8 fL (ref 80.0–100.0)
Monocytes Absolute: 0.6 10*3/uL (ref 0.1–1.0)
Monocytes Relative: 8 %
Neutro Abs: 4.5 10*3/uL (ref 1.7–7.7)
Neutrophils Relative %: 60 %
Platelets: 288 10*3/uL (ref 150–400)
RBC: 5.17 MIL/uL (ref 4.22–5.81)
RDW: 12.3 % (ref 11.5–15.5)
WBC: 7.4 10*3/uL (ref 4.0–10.5)
nRBC: 0 % (ref 0.0–0.2)

## 2023-03-13 LAB — URINALYSIS, ROUTINE W REFLEX MICROSCOPIC
Bilirubin Urine: NEGATIVE
Glucose, UA: >=500 mg/dL — AB
Hgb urine dipstick: NEGATIVE
Ketones, ur: NEGATIVE mg/dL
Leukocytes,Ua: NEGATIVE
Nitrite: NEGATIVE
Protein, ur: NEGATIVE mg/dL
Specific Gravity, Urine: 1.02 (ref 1.005–1.030)
pH: 7 (ref 5.0–8.0)

## 2023-03-13 LAB — COMPREHENSIVE METABOLIC PANEL
ALT: 40 U/L (ref 0–44)
AST: 24 U/L (ref 15–41)
Albumin: 4.2 g/dL (ref 3.5–5.0)
Alkaline Phosphatase: 56 U/L (ref 38–126)
Anion gap: 9 (ref 5–15)
BUN: 14 mg/dL (ref 6–20)
CO2: 24 mmol/L (ref 22–32)
Calcium: 8.9 mg/dL (ref 8.9–10.3)
Chloride: 104 mmol/L (ref 98–111)
Creatinine, Ser: 0.65 mg/dL (ref 0.61–1.24)
GFR, Estimated: >60 mL/min (ref >60)
Glucose, Bld: 197 mg/dL — ABNORMAL HIGH (ref 70–99)
Potassium: 4.1 mmol/L (ref 3.5–5.1)
Sodium: 137 mmol/L (ref 135–145)
Total Bilirubin: 0.6 mg/dL (ref 0.3–1.2)
Total Protein: 7 g/dL (ref 6.5–8.1)

## 2023-03-13 LAB — LIPASE, BLOOD: Lipase: 27 U/L (ref 11–51)

## 2023-03-13 MED ORDER — BISACODYL 5 MG PO TBEC
5.0000 mg | DELAYED_RELEASE_TABLET | Freq: Two times a day (BID) | ORAL | 0 refills | Status: AC
Start: 2023-03-13 — End: ?

## 2023-03-13 MED ORDER — IOHEXOL 300 MG/ML  SOLN
100.0000 mL | Freq: Once | INTRAMUSCULAR | Status: AC | PRN
  Administered 2023-03-13: 100 mL via INTRAVENOUS

## 2023-03-13 NOTE — ED Provider Notes (Signed)
[facility] Provider Note   CSN: [id number] Arrival date & time: 03/13/23  1343     History  Chief Complaint  Patient presents with   Abdominal Pain    Brian Pittman is a 57 y.o. male history of diabetes, hyperlipidemia presents today for evaluation of abdominal pain.  Symptoms started 3 days ago.  Pain is in the right lower quadrant, intermittent, worse with movement.  Endorses diarrhea.  Last BM was yesterday. Denies any blood in his stool.  Denies fever, chest pain, shortness of breath, urinary symptoms.  History of cholecystectomy.  Has not tried any medication at home for pain.   Abdominal Pain   Past Medical History:  Diagnosis Date   Body mass index (BMI) 31.0-31.9, adult    DM2 (diabetes mellitus, type 2) (HCC)    Ear infection    Generalized anxiety disorder 06/28/2018   GERD (gastroesophageal reflux disease)    Hypercholesterolemia    Panic disorder (episodic paroxysmal anxiety) 09/14/2016   Pneumonia    Sleep apnea    Trochanteric bursitis of right hip 05/17/2018   Past Surgical History:  Procedure Laterality Date   CHOLECYSTECTOMY     COLONOSCOPY     Dr Rayfield Citizen around 2016 said it was normal     Home Medications Prior to Admission medications   Medication Sig Start Date End Date Taking? Authorizing Provider  dapagliflozin propanediol (FARXIGA) 10 MG TABS tablet Take 1 tablet (10 mg total) by mouth daily. 10/23/22   Shamleffer, Konrad Dolores, MD  diclofenac (VOLTAREN) 75 MG EC tablet Take 1 tablet (75 mg total) by mouth 2 (two) times daily. 02/01/23   Hudnall, Azucena Fallen, MD  famciclovir (FAMVIR) 500 MG tablet Take 1 tablet (500 mg total) by mouth 3 (three) times daily. 10/29/21   Saguier, Ramon Dredge, PA-C  glipiZIDE (GLUCOTROL) 5 MG tablet Take 2 tablets (10 mg total) by mouth daily before breakfast AND 1.5 tablets (7.5 mg total) daily before supper. 10/23/22   Shamleffer, Konrad Dolores, MD  levocetirizine (XYZAL) 5 MG  tablet Take 1 tablet (5 mg total) by mouth every evening. 10/29/22   Saguier, Ramon Dredge, PA-C  meloxicam (MOBIC) 7.5 MG tablet Take 1 tablet (7.5 mg total) by mouth 2 (two) times daily as needed. Patient not taking: Reported on 10/23/2022 05/21/21   Myra Rude, MD  omeprazole (PRILOSEC) 40 MG capsule Take 1 capsule (40 mg total) by mouth daily. 02/24/23   Saguier, Ramon Dredge, PA-C  rosuvastatin (CRESTOR) 10 MG tablet TAKE ONE TABLET BY MOUTH DAILY 02/25/23   Saguier, Ramon Dredge, PA-C  sodium polystyrene (KAYEXALATE) 15 GM/60ML suspension 60 ml twice daily for 3 days 06/29/20   Saguier, Ramon Dredge, PA-C      Allergies    Jardiance [empagliflozin], Metformin, Ozempic (0.25 or 0.5 mg-dose) [semaglutide(0.25 or 0.5mg -dos)], Penicillins, Prednisone, Ertugliflozin, and Lovastatin    Review of Systems   Review of Systems  Gastrointestinal:  Positive for abdominal pain.    Physical Exam Updated Vital Signs BP (!) 146/75 (BP Location: Left Arm)   Pulse 64   Temp 97.7 F (36.5 C) (Oral)   Resp 16   Ht 5\' 10"  (1.778 m)   Wt 88.5 kg   SpO2 96%   BMI 27.98 kg/m  Physical Exam Vitals and nursing note reviewed.  Constitutional:      Appearance: Normal appearance.  HENT:     Head: Normocephalic and atraumatic.     Mouth/Throat:     Mouth: Mucous membranes are moist.  Eyes:     General: No scleral icterus. Cardiovascular:     Rate and Rhythm: Normal rate and regular rhythm.     Pulses: Normal pulses.     Heart sounds: Normal heart sounds.  Pulmonary:     Effort: Pulmonary effort is normal.     Breath sounds: Normal breath sounds.  Abdominal:     General: Abdomen is flat.     Palpations: Abdomen is soft.     Tenderness: There is abdominal tenderness in the right lower quadrant.  Musculoskeletal:        General: No deformity.  Skin:    General: Skin is warm.     Findings: No rash.  Neurological:     General: No focal deficit present.     Mental Status: He is alert.  Psychiatric:        Mood  and Affect: Mood normal.     ED Results / Procedures / Treatments   Labs (all labs ordered are listed, but only abnormal results are displayed) Labs Reviewed - No data to display  EKG None  Radiology No results found.  Procedures Procedures    Medications Ordered in ED Medications - No data to display  ED Course/ Medical Decision Making/ A&P                                 Medical Decision Making Amount and/or Complexity of Data Reviewed Labs: ordered. Radiology: ordered.  Risk OTC drugs. Prescription drug management.   This patient presents to the ED for abdominal pain, this involves an extensive number of treatment options, and is a complaint that carries with a high risk of complications and morbidity.  The differential diagnosis includes diverticulitis, hernia, diverticulitis, UTI, constipation, male- testicular torsion, orchitis epididymitis, Fournier's.  This is not an exhaustive list.  Lab tests: I ordered and personally interpreted labs.  The pertinent results include: WBC unremarkable. Hbg unremarkable. Platelets unremarkable. Electrolytes unremarkable. BUN, creatinine unremarkable.  Lipase is normal.  UA is unremarkable.  Imaging studies: I ordered imaging studies, personally reviewed, interpreted imaging and agree with the radiologist's interpretations. The results include: CT abdomen pelvis showed moderate stool burden in the right colon.  No obstruction.  Problem list/ ED course/ Critical interventions/ Medical management: HPI: See above Vital signs within normal range and stable throughout visit. Laboratory/imaging studies significant for: See above. On physical examination, patient is afebrile and appears in no acute distress.  There was tenderness to palpation to the right lower quadrant. Abdominal exam without peritoneal signs. No evidence of acute abdomen at this time, low suspicion for appendicitis given negative CT scan. Given work up, low  suspicion for acute hepatobiliary disease (including acute cholecystitis or cholangitis), acute infectious processes (pneumonia, hepatitis, pyelonephritis), bowel obstruction, or viscus perforation. Presentation not consistent with other acute, emergent causes of abdominal pain at this time. No testicular pain to concern for testicular torsion. Will send an Rx of Dulcolax. Advised patient to take Tylenol/ibuprofen/naproxen for pain, follow-up with primary care physician for further evaluation and management, return to the ER if new or worsening symptoms. I have reviewed the patient home medicines and have made adjustments as needed.  Cardiac monitoring/EKG: The patient was maintained on a cardiac monitor.  I personally reviewed and interpreted the cardiac monitor which showed an underlying rhythm of: sinus rhythm.  Additional history obtained: External records from outside source obtained and reviewed including: Chart review including previous notes,  labs, imaging.  Consultations obtained:  Disposition Continued outpatient therapy. Follow-up with PCP recommended for reevaluation of symptoms. Treatment plan discussed with patient.  Pt acknowledged understanding was agreeable to the plan. Worrisome signs and symptoms were discussed with patient, and patient acknowledged understanding to return to the ED if they noticed these signs and symptoms. Patient was stable upon discharge.   This chart was dictated using voice recognition software.  Despite best efforts to proofread,  errors can occur which can change the documentation meaning.          Final Clinical Impression(s) / ED Diagnoses Final diagnoses:  Right lower quadrant abdominal pain    Rx / DC Orders ED Discharge Orders          Ordered    bisacodyl (DULCOLAX) 5 MG EC tablet  2 times daily        03/13/23 1724              Jeanelle Malling, [place] 03/13/23 1733    Rolan Bucco, MD 03/14/23 [phone]

## 2023-03-13 NOTE — Discharge Instructions (Addendum)
Please follow up with your primary care doctor within 2-3 days. For constipation we also recommend a diet high in fiber (beans, fruits, vegetables, whole grains). Take Colace 100-200 mg up to three times per day. You may take along with Senokot 1-2 tabs, ingest with full glass of water.  You may also take MiraLAX 1-2 capfuls 1-2 times a day until stools become soft and then slowly decrease the amount of MiraLAX used.  Maintain fluid intake 6-8 glasses per day. Please increase fibers in your diet. You may also take Milk of Magnesia 30 mL as needed for constipation, you may repeat in 2 hours again if no bowl movement.  

## 2023-03-13 NOTE — ED Triage Notes (Signed)
The patient having right lower abd pain for days. No vomiting or fever.

## 2023-03-17 ENCOUNTER — Ambulatory Visit (INDEPENDENT_AMBULATORY_CARE_PROVIDER_SITE_OTHER): Payer: BC Managed Care – PPO | Admitting: Medical

## 2023-03-17 ENCOUNTER — Encounter: Payer: Self-pay | Admitting: Medical

## 2023-03-17 VITALS — BP 124/74 | HR 60 | Temp 98.0°F | Resp 18 | Ht 70.0 in | Wt 196.0 lb

## 2023-03-17 DIAGNOSIS — Z0001 Encounter for general adult medical examination with abnormal findings: Secondary | ICD-10-CM | POA: Diagnosis not present

## 2023-03-17 DIAGNOSIS — Z125 Encounter for screening for malignant neoplasm of prostate: Secondary | ICD-10-CM

## 2023-03-17 DIAGNOSIS — R6882 Decreased libido: Secondary | ICD-10-CM | POA: Diagnosis not present

## 2023-03-17 DIAGNOSIS — R5383 Other fatigue: Secondary | ICD-10-CM | POA: Diagnosis not present

## 2023-03-17 DIAGNOSIS — Z0000 Encounter for general adult medical examination without abnormal findings: Secondary | ICD-10-CM | POA: Diagnosis not present

## 2023-03-17 DIAGNOSIS — R7989 Other specified abnormal findings of blood chemistry: Secondary | ICD-10-CM

## 2023-03-17 LAB — T4, FREE: Free T4: 0.74 ng/dL (ref 0.60–1.60)

## 2023-03-17 LAB — LIPID PANEL
Cholesterol: 112 mg/dL (ref 0–200)
HDL: 35.4 mg/dL — ABNORMAL LOW (ref >39.00)
LDL Cholesterol: 60 mg/dL (ref 0–99)
NonHDL: 76.18
Total CHOL/HDL Ratio: 3
Triglycerides: 83 mg/dL (ref 0.0–149.0)
VLDL: 16.6 mg/dL (ref 0.0–40.0)

## 2023-03-17 LAB — CBC WITH DIFFERENTIAL/PLATELET
Basophils Absolute: 0.1 10*3/uL (ref 0.0–0.1)
Basophils Relative: 0.9 % (ref 0.0–3.0)
Eosinophils Absolute: 0.2 10*3/uL (ref 0.0–0.7)
Eosinophils Relative: 2.2 % (ref 0.0–5.0)
HCT: 46.2 % (ref 39.0–52.0)
Hemoglobin: 15.4 g/dL (ref 13.0–17.0)
Lymphocytes Relative: 27.5 % (ref 12.0–46.0)
Lymphs Abs: 1.9 10*3/uL (ref 0.7–4.0)
MCHC: 33.4 g/dL (ref 30.0–36.0)
MCV: 90.3 fl (ref 78.0–100.0)
Monocytes Absolute: 0.7 10*3/uL (ref 0.1–1.0)
Monocytes Relative: 9.6 % (ref 3.0–12.0)
Neutro Abs: 4.1 10*3/uL (ref 1.4–7.7)
Neutrophils Relative %: 59.8 % (ref 43.0–77.0)
Platelets: 291 10*3/uL (ref 150.0–400.0)
RBC: 5.12 Mil/uL (ref 4.22–5.81)
RDW: 13 % (ref 11.5–15.5)
WBC: 6.9 10*3/uL (ref 4.0–10.5)

## 2023-03-17 LAB — COMPREHENSIVE METABOLIC PANEL
ALT: 33 U/L (ref 0–53)
AST: 20 U/L (ref 0–37)
Albumin: 4.5 g/dL (ref 3.5–5.2)
Alkaline Phosphatase: 58 U/L (ref 39–117)
BUN: 17 mg/dL (ref 6–23)
CO2: 30 mEq/L (ref 19–32)
Calcium: 9.6 mg/dL (ref 8.4–10.5)
Chloride: 101 mEq/L (ref 96–112)
Creatinine, Ser: 0.68 mg/dL (ref 0.40–1.50)
GFR: 104 mL/min (ref >60.00)
Glucose, Bld: 149 mg/dL — ABNORMAL HIGH (ref 70–99)
Potassium: 4.7 mEq/L (ref 3.5–5.1)
Sodium: 138 mEq/L (ref 135–145)
Total Bilirubin: 0.9 mg/dL (ref 0.2–1.2)
Total Protein: 6.6 g/dL (ref 6.0–8.3)

## 2023-03-17 LAB — PSA: PSA: 0.89 ng/mL (ref 0.10–4.00)

## 2023-03-17 LAB — TSH: TSH: 4.22 u[IU]/mL (ref 0.35–5.50)

## 2023-03-17 LAB — VITAMIN B12: Vitamin B-12: 316 pg/mL (ref 211–911)

## 2023-03-17 NOTE — Patient Instructions (Addendum)
1.For you wellness exam today I have ordered cbc, cmp, psa, lipid panel, ua and hiv.  Vaccines today tdap Flu vaccine and shingrix declined today. Can get done later when you are ready.  Ask you get diabetic eye exam and have office send [place] the report.  Recommend exercise and healthy diet.  We will let you know lab results as they come in.  Follow up date appointment will be determined after lab review.     2. Screening for prostate cancer - PSA  3. Fatigue, unspecified type - TSH - T4, free - B12 - Testosterone Total,Free,Bio, Males-([facility])  4. Low libido - Testosterone Total,Free,Bio, Males-([facility])   For recent constipation despite dulcolax can try mag citrate as explained.  Preventive Care 24-42 Years Old, Male Preventive care refers to lifestyle choices and visits with your health care provider that can promote health and wellness. Preventive care visits are also called wellness exams. What can I expect for my preventive care visit? Counseling During your preventive care visit, your health care provider may ask about your: Medical history, including: Past medical problems. Family medical history. Current health, including: Emotional well-being. Home life and relationship well-being. Sexual activity. Lifestyle, including: Alcohol, nicotine or tobacco, and drug use. Access to firearms. Diet, exercise, and sleep habits. Safety issues such as seatbelt and bike helmet use. Sunscreen use. Work and work Astronomer. Physical exam Your health care provider will check your: Height and weight. These may be used to calculate your BMI (body mass index). BMI is a measurement that tells if you are at a healthy weight. Waist circumference. This measures the distance around your waistline. This measurement also tells if you are at a healthy weight and may help predict your risk of certain diseases, such as type 2 diabetes and high blood pressure. Heart rate and blood  pressure. Body temperature. Skin for abnormal spots. What immunizations do I need?  Vaccines are usually given at various ages, according to a schedule. Your health care provider will recommend vaccines for you based on your age, medical history, and lifestyle or other factors, such as travel or where you work. What tests do I need? Screening Your health care provider may recommend screening tests for certain conditions. This may include: Lipid and cholesterol levels. Diabetes screening. This is done by checking your blood sugar (glucose) after you have not eaten for a while (fasting). Hepatitis B test. Hepatitis C test. HIV (human immunodeficiency virus) test. STI (sexually transmitted infection) testing, if you are at risk. Lung cancer screening. Prostate cancer screening. Colorectal cancer screening. Talk with your health care provider about your test results, treatment options, and if necessary, the need for more tests. Follow these instructions at home: Eating and drinking  Eat a diet that includes fresh fruits and vegetables, whole grains, lean protein, and low-fat dairy products. Take vitamin and mineral supplements as recommended by your health care provider. Do not drink alcohol if your health care provider tells you not to drink. If you drink alcohol: Limit how much you have to 0-2 drinks a day. Know how much alcohol is in your drink. In the [place] one drink equals one 12 oz bottle of beer (355 mL), one 5 oz glass of wine (148 mL), or one 1 oz glass of hard liquor (44 mL). Lifestyle Brush your teeth every morning and night with fluoride toothpaste. Floss one time each day. Exercise for at least 30 minutes 5 or more days each week. Do not use any products that contain  nicotine or tobacco. These products include cigarettes, chewing tobacco, and vaping devices, such as e-cigarettes. If you need help quitting, ask your health care provider. Do not use drugs. If you are sexually  active, practice safe sex. Use a condom or other form of protection to prevent STIs. Take aspirin only as told by your health care provider. Make sure that you understand how much to take and what form to take. Work with your health care provider to find out whether it is safe and beneficial for you to take aspirin daily. Find healthy ways to manage stress, such as: Meditation, yoga, or listening to music. Journaling. Talking to a trusted person. Spending time with friends and family. Minimize exposure to UV radiation to reduce your risk of skin cancer. Safety Always wear your seat belt while driving or riding in a vehicle. Do not drive: If you have been drinking alcohol. Do not ride with someone who has been drinking. When you are tired or distracted. While texting. If you have been using any mind-altering substances or drugs. Wear a helmet and other protective equipment during sports activities. If you have firearms in your house, make sure you follow all gun safety procedures. What's next? Go to your health care provider once a year for an annual wellness visit. Ask your health care provider how often you should have your eyes and teeth checked. Stay up to date on all vaccines. This information is not intended to replace advice given to you by your health care provider. Make sure you discuss any questions you have with your health care provider. Document Revised: 01/01/2021 Document Reviewed: 01/01/2021 Elsevier Patient Education  2024 [employer].

## 2023-03-17 NOTE — Progress Notes (Signed)
Subjective:    Patient ID: Brian Pittman, male    DOB: 1965-08-21, 57 y.o.   MRN: [id number]  HPI  Pt in for wellness exam. Pt is fasting.  Pt states busy with work. No regular exercise apart from work. Moderate healthy diet but difficult when on the road. No alcohol and non smoker.   Pt will get tdap today. Pt declined flu vaccine today. Will consider shingrix as well on separate day.   Diabetic eye exam due. Pt acknowledges time to get other one.  Pt did see Dr. Lonzo Cloud. Pt is on Farxiga and glipizide.  Since seeing her pt has been eating a lot less sugar.  Pt states seen in ED for abdomen pain. Dx with constipation. Told to use dulcoloax. He has passed small bowel movements. He had CT.      Describes daily fatigue. Mild moderate and low libido.  Review of Systems  Constitutional:  Positive for fatigue. Negative for chills and fever.  HENT:  Negative for congestion and ear pain.   Respiratory:  Negative for chest tightness, shortness of breath and wheezing.   Cardiovascular:  Negative for chest pain and palpitations.  Gastrointestinal:  Negative for abdominal distention, abdominal pain, blood in stool and constipation.       Recent dx constipation in the emergency dept.   Genitourinary:  Negative for dysuria, frequency, penile discharge and urgency.  Neurological:  Negative for dizziness, syncope and light-headedness.  Hematological:  Negative for adenopathy. Does not bruise/bleed easily.  Psychiatric/Behavioral:  Negative for behavioral problems and dysphoric mood.     Past Medical History:  Diagnosis Date   Body mass index (BMI) 31.0-31.9, adult    DM2 (diabetes mellitus, type 2) (HCC)    Ear infection    Generalized anxiety disorder 06/28/2018   GERD (gastroesophageal reflux disease)    Hypercholesterolemia    Panic disorder (episodic paroxysmal anxiety) 09/14/2016   Pneumonia    Sleep apnea    Trochanteric bursitis of right hip 05/17/2018     Social History    Socioeconomic History   Marital status: Single    Spouse name: Not on file   Number of children: Not on file   Years of education: Not on file   Highest education level: Not on file  Occupational History   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, 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  Tobacco Use   Smoking status: Never   Smokeless tobacco: Former    Types: Snuff    Quit date: 10/2018   Tobacco comments:    quit over 20 years ago  Vaping Use   Vaping status: Never Used  Substance and Sexual Activity   Alcohol use: Yes    Comment: drinks alchohol on a regular basis, typically 1 beer daily   Drug use: No   Sexual activity: Not on file  Other Topics Concern   Not on file  Social History Narrative   Not on file   Social Determinants of Health   Financial Resource Strain: Not on file  Food Insecurity: Not on file  Transportation Needs: Not on file  Physical Activity: Not on file  Stress: Not on file  Social Connections: Not on file  Intimate Partner Violence: Not on file    Past Surgical History:  Procedure Laterality Date   CHOLECYSTECTOMY     COLONOSCOPY     Dr Rayfield Citizen around 2016 said it was normal    Family History  Problem Relation Age of Onset   Diabetes  Mother    Diabetes Father    Leukemia Father    Skin cancer Father    Heart attack Father    Atrial fibrillation Father    Lung cancer Maternal Grandfather    Heart attack Paternal Grandmother    Leukemia Paternal Grandmother    Stroke Paternal Grandfather    Leukemia Paternal Grandfather    GER disease Sister    Arthritis Sister    Colon cancer Neg Hx    Esophageal cancer Neg Hx    Stomach cancer Neg Hx     Allergies  Allergen Reactions   Jardiance [Empagliflozin]    Metformin Diarrhea   Ozempic (0.25 Or 0.5 Mg-Dose) [Semaglutide(0.25 Or 0.5mg -Dos)] Nausea Only   Penicillins Hives and Itching   Prednisone     Elevates blood surgar, increased heart rate   Ertugliflozin Rash   Lovastatin Rash     Current Outpatient Medications on File Prior to Visit  Medication Sig Dispense Refill   bisacodyl (DULCOLAX) 5 MG EC tablet Take 1 tablet (5 mg total) by mouth 2 (two) times daily. 14 tablet 0   dapagliflozin propanediol (FARXIGA) 10 MG TABS tablet Take 1 tablet (10 mg total) by mouth daily. 30 tablet 11   diclofenac (VOLTAREN) 75 MG EC tablet Take 1 tablet (75 mg total) by mouth 2 (two) times daily. 60 tablet 1   famciclovir (FAMVIR) 500 MG tablet Take 1 tablet (500 mg total) by mouth 3 (three) times daily. 21 tablet 0   glipiZIDE (GLUCOTROL) 5 MG tablet Take 2 tablets (10 mg total) by mouth daily before breakfast AND 1.5 tablets (7.5 mg total) daily before supper. 315 tablet 3   levocetirizine (XYZAL) 5 MG tablet Take 1 tablet (5 mg total) by mouth every evening. 90 tablet 0   meloxicam (MOBIC) 7.5 MG tablet Take 1 tablet (7.5 mg total) by mouth 2 (two) times daily as needed. (Patient not taking: Reported on 10/23/2022) 45 tablet 1   omeprazole (PRILOSEC) 40 MG capsule Take 1 capsule (40 mg total) by mouth daily. 90 capsule 0   rosuvastatin (CRESTOR) 10 MG tablet TAKE ONE TABLET BY MOUTH DAILY 30 tablet 0   sodium polystyrene (KAYEXALATE) 15 GM/60ML suspension 60 ml twice daily for 3 days 360 mL 0   No current facility-administered medications on file prior to visit.    BP 124/74   Pulse 60   Temp 98 F (36.7 C)   Resp 18   Ht 5\' 10"  (1.778 m)   Wt 196 lb (88.9 kg)   SpO2 99%   BMI 28.12 kg/m        Objective:   Physical Exam  General Mental Status- Alert. General Appearance- Not in acute distress.   Skin General: Color- Normal Color. Moisture- Normal Moisture.  Neck Carotid Arteries- Normal color. Moisture- Normal Moisture. No carotid bruits. No JVD.  Chest and Lung Exam Auscultation: Breath Sounds:-Normal.  Cardiovascular Auscultation:Rythm- Regular. Murmurs & Other Heart Sounds:Auscultation of the heart reveals- No Murmurs.  Abdomen Inspection:-Inspeection  Normal. Palpation/Percussion:Note:No mass. Palpation and Percussion of the abdomen reveal- Non Tender, Non Distended + BS, no rebound or guarding.   Neurologic Cranial Nerve exam:- CN III-XII intact(No nystagmus), symmetric smile. Strength:- 5/5 equal and symmetric strength both upper and lower extremities.   Lower ext- see quality metrics.    Assessment & Plan:   Patient Instructions  1.For you wellness exam today I have ordered cbc, cmp, psa, lipid panel, ua and hiv.  Vaccines today tdap Flu vaccine and  shingrix declined today. Can get done later when you are ready.  Recommend exercise and healthy diet.  We will let you know lab results as they come in.  Follow up date appointment will be determined after lab review.     2. Screening for prostate cancer - PSA  3. Fatigue, unspecified type - TSH - T4, free - B12 - Testosterone Total,Free,Bio, Males-([facility])  4. Low libido - Testosterone Total,Free,Bio, Males-([facility])   For recent constipation despite dulcolax can try mag citrate as explained.    Esperanza Richters, PA-C   [phone] charge today as well. Addressed fatigue, low libido, discussed benefit vs risk testosterone if low  and gave treatment advise on constipation.

## 2023-03-18 LAB — TESTOSTERONE TOTAL,FREE,BIO, MALES
Albumin: 4.5 g/dL (ref 3.6–5.1)
Sex Hormone Binding: 17 nmol/L — ABNORMAL LOW (ref 22–77)
Testosterone: 167 ng/dL — ABNORMAL LOW (ref 250–827)

## 2023-03-18 NOTE — Addendum Note (Signed)
Addended by: Gwenevere Abbot on: 03/18/2023 02:40 PM   Modules accepted: Orders

## 2023-03-23 DIAGNOSIS — M25511 Pain in right shoulder: Secondary | ICD-10-CM | POA: Diagnosis not present

## 2023-04-01 ENCOUNTER — Other Ambulatory Visit: Payer: Self-pay | Admitting: Medical

## 2023-04-15 IMAGING — DX DG CERVICAL SPINE 2 OR 3 VIEWS
4 series · 4 of 4 positions shown · non-contrast
Comparison: None.

CLINICAL DATA: Radicular pain

EXAM:
CERVICAL SPINE - 2-3 VIEW

[c-spine lat]
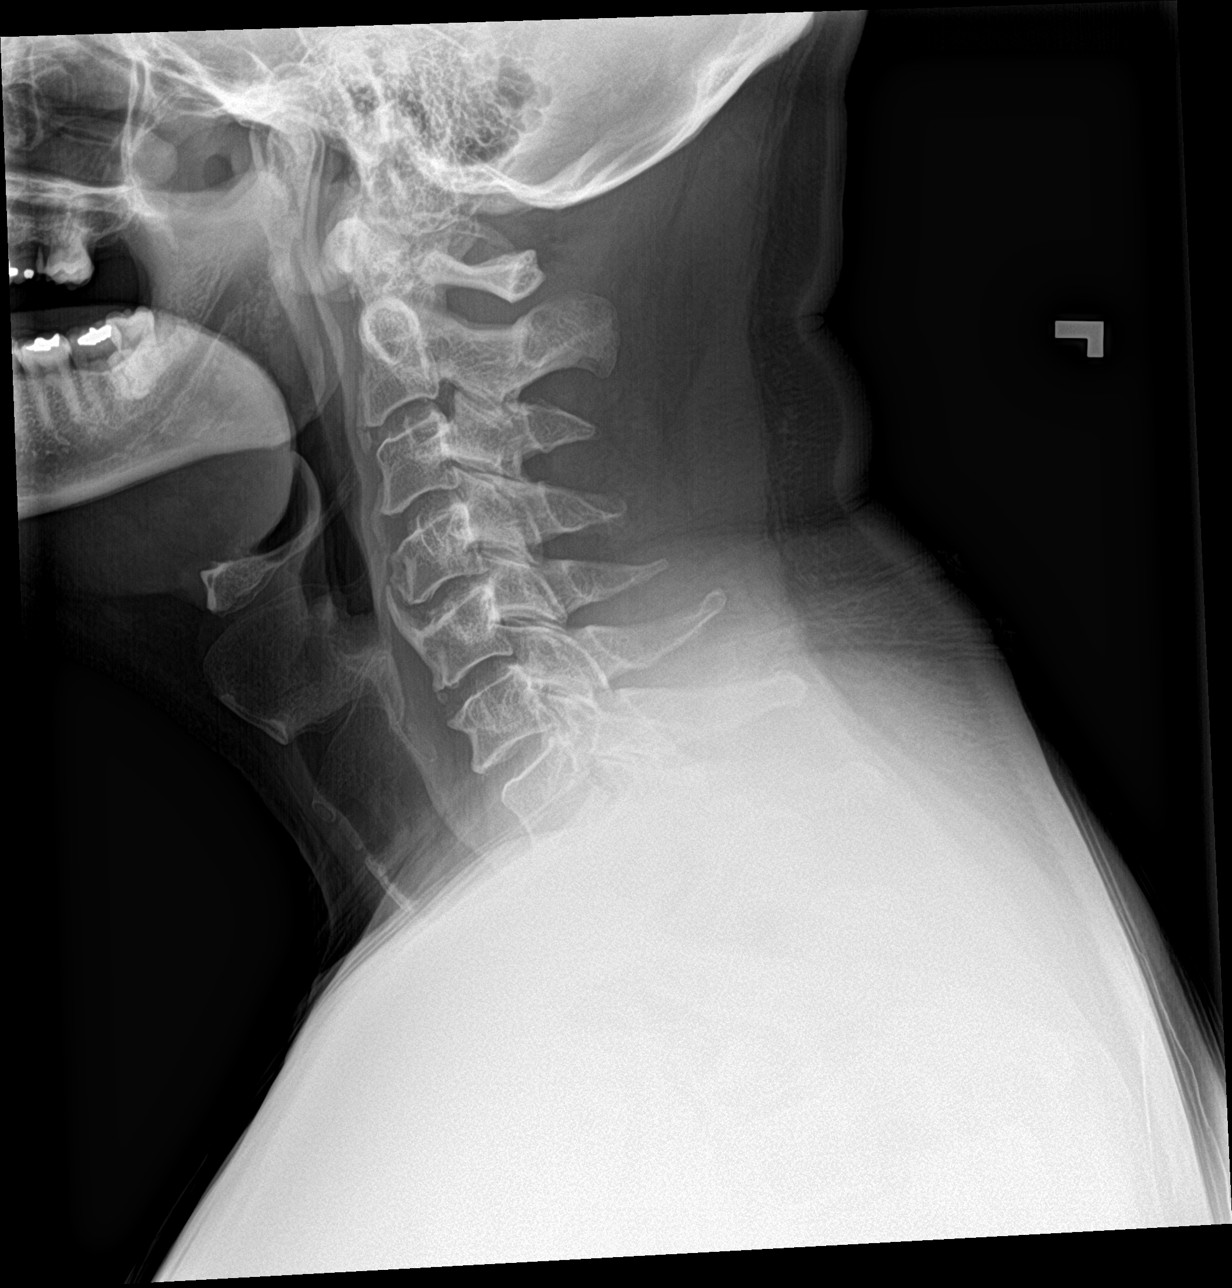

[c-spine ap]
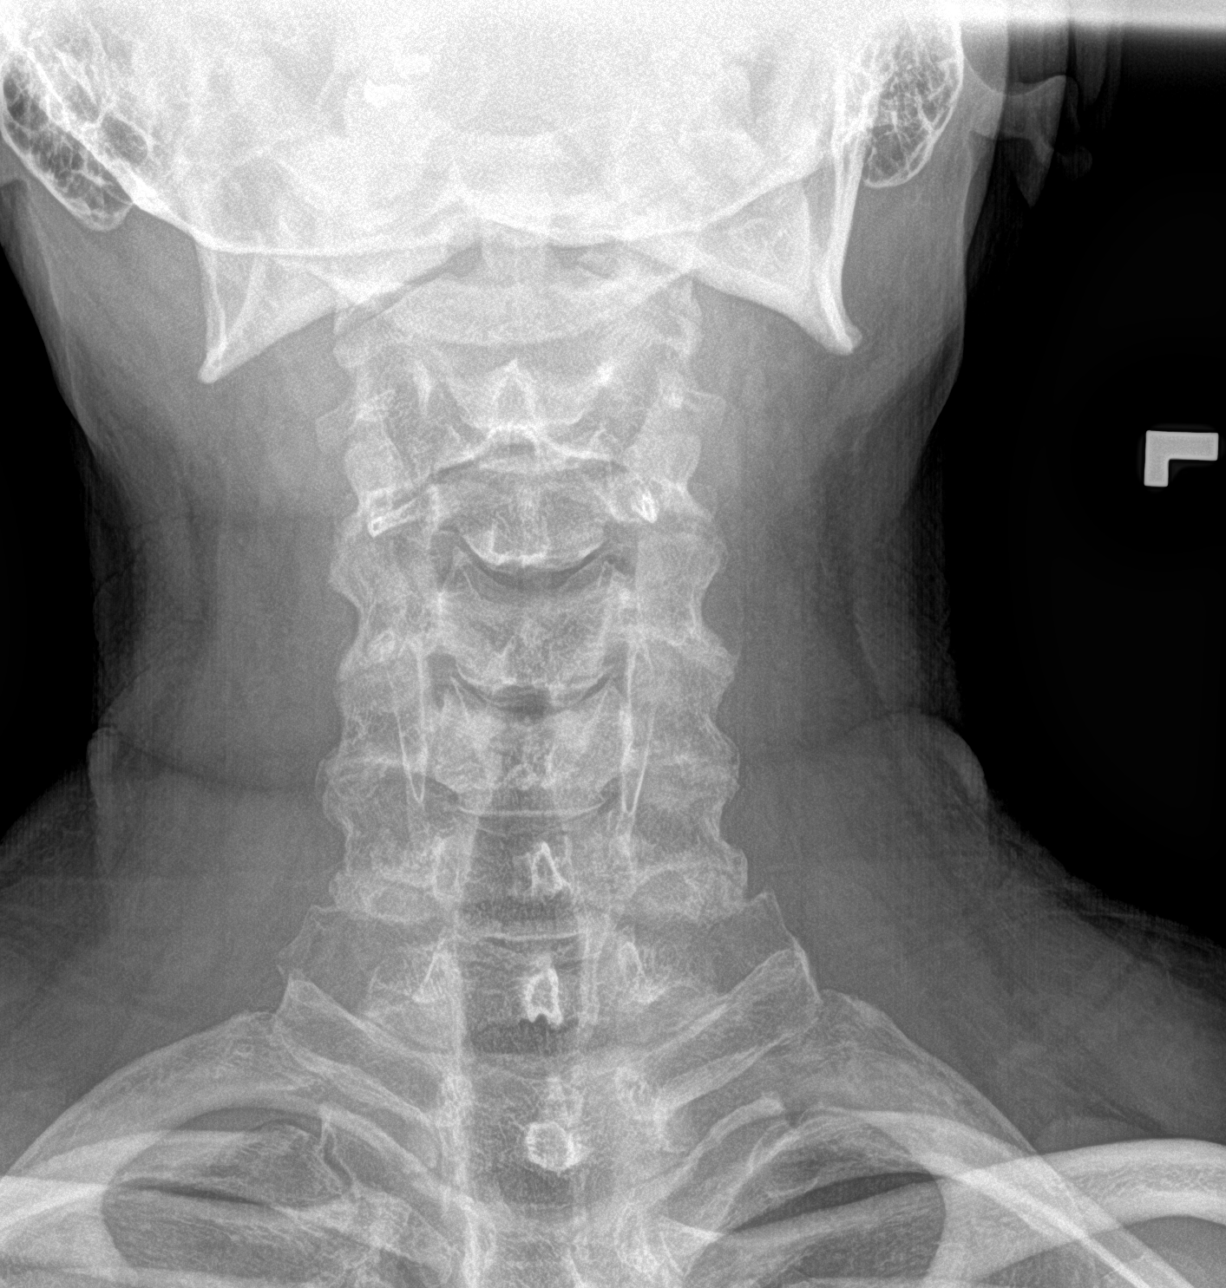

[c-spine open mouth]
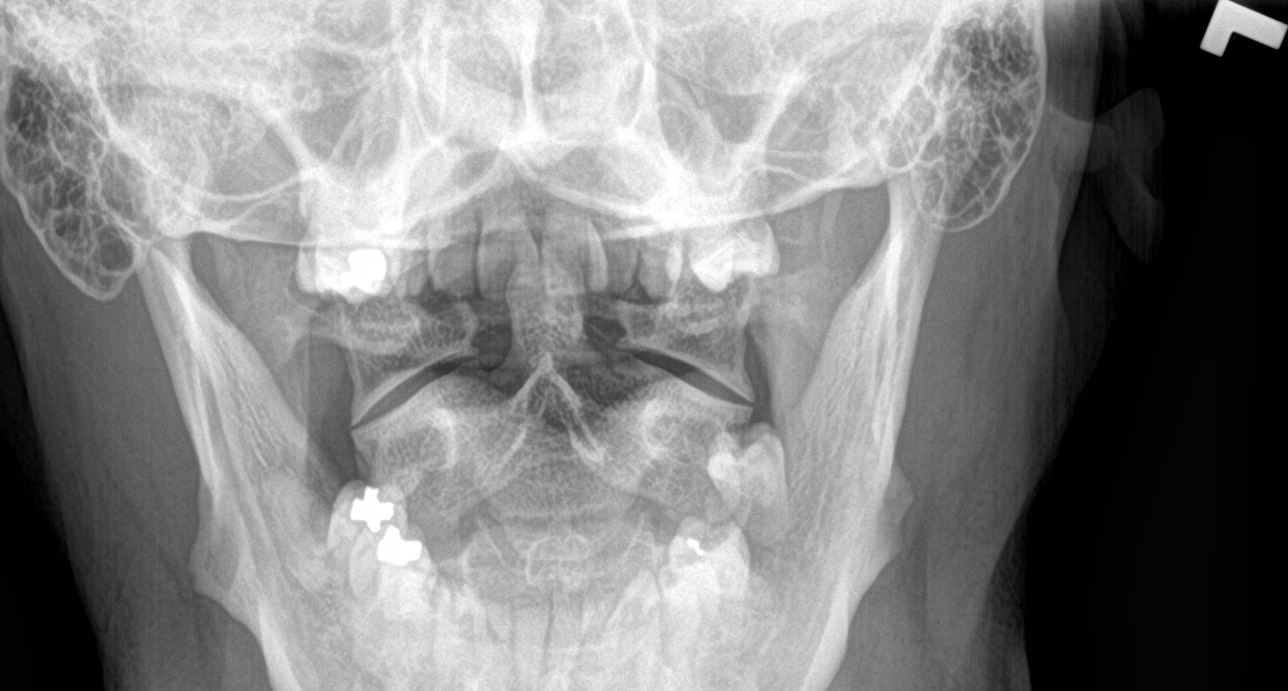

[c-spine swimmers]
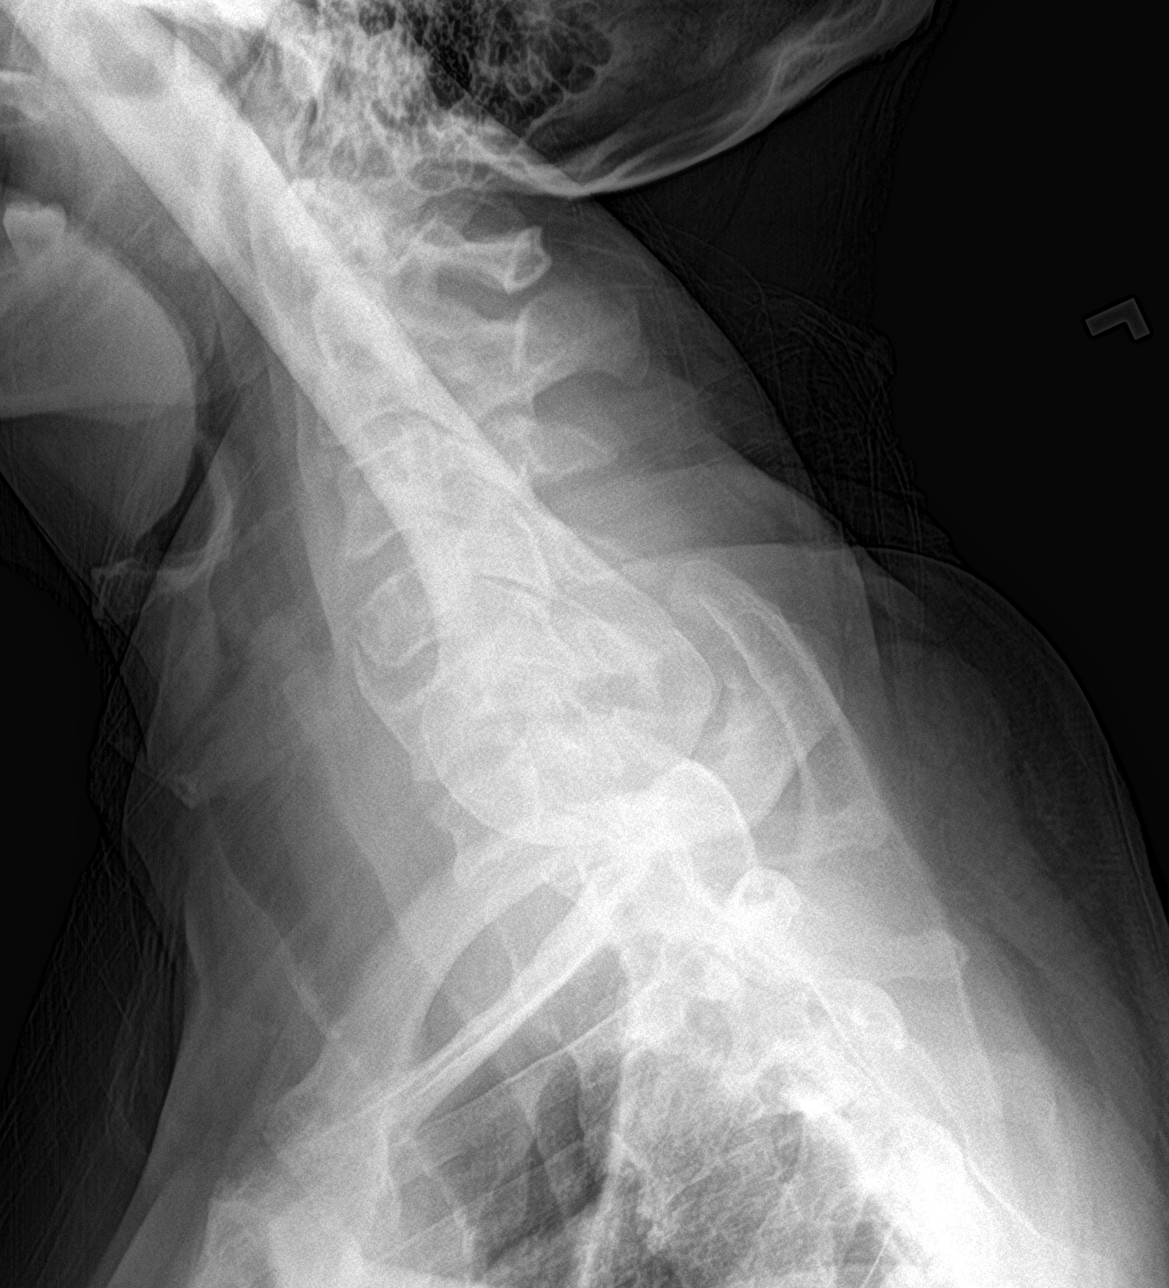

[4 of 4 positions shown; findings below may reference images not displayed]

FINDINGS: There is no evidence of cervical spine fracture or prevertebral soft
tissue swelling. Alignment is normal. No other significant bone
abnormalities are identified.
IMPRESSION: No acute osseous abnormality identified.

## 2023-04-26 ENCOUNTER — Ambulatory Visit: Payer: BC Managed Care – PPO | Admitting: Internal Medicine

## 2023-05-04 ENCOUNTER — Other Ambulatory Visit: Payer: Self-pay | Admitting: Medical

## 2023-05-10 ENCOUNTER — Other Ambulatory Visit: Payer: Self-pay | Admitting: Medical

## 2023-05-15 ENCOUNTER — Other Ambulatory Visit: Payer: Self-pay | Admitting: Medical

## 2023-06-21 NOTE — Progress Notes (Unsigned)
Name: Brian Pittman  Age/ Sex: 57 y.o., male   MRN/ DOB: [id number], 05-Jan-1966     PCP: [name]   Reason for Endocrinology Evaluation: Type 2 Diabetes Mellitus  Initial Endocrine Consultative Visit: 02/14/2020    PATIENT IDENTIFIER: Brian Pittman is a 57 y.o. male with a past medical history of T2DM and Dyslipidemia. The patient has followed with Endocrinology clinic since 02/14/2020 for consultative assistance with management of his diabetes.  DIABETIC HISTORY:  Brian Pittman was diagnosed with DM in 2015,metformin - GI side effects . Ozempic/Trulicity - sour stomach and gas . His hemoglobin A1c has ranged from 7.1% in 08/2019, peaking at 7.5% in 03/2020.  On his initial to our clinic he had an A1c of 7.1 %. He was on Amaryl and Qtern. We switched Glimepiride to Glipizide and continued Qtern but by 07/2020 [name] became cost prohibitve and started [place]   SUBJECTIVE:   During the last visit (10/23/2022): A1c 8.3 %   Today (06/21/2023): Brian Pittman is here for a follow up on diabetes. [name]  He checks his blood sugars 2 times daily. The patient has not had hypoglycemic episodes since the last clinic visit.   He follows with sports medicine for right shoulder capsulitis Has occasional burning and feeling of pin needles in the morning   He required prednisone in March, resulting in hyperglycemia in the low 200's mg/dL   Denies dizziness , unless while on cough syrup  Denies nausea, vomiting or diarrhea     HOME DIABETES REGIMEN:  Glipizide 5 mg 2 tabs before breakfast and 1.5 tabs before supper Farxiga 10 mg daily      Statin: Yes ACE-I/ARB: no   GLUCOSE LOG:  116-203  mg/dL     DIABETIC COMPLICATIONS: Microvascular complications:   Denies: CKD, neuropathy, retinopathy  Last Eye Exam: Completed 2022 Dr. Hazle Quant   Macrovascular complications:   Denies: CAD, CVA, PVD   HISTORY:  Past Medical History:  Past Medical History:  Diagnosis Date   Body mass index  (BMI) 31.0-31.9, adult    DM2 (diabetes mellitus, type 2) (HCC)    Ear infection    Generalized anxiety disorder 06/28/2018   GERD (gastroesophageal reflux disease)    Hypercholesterolemia    Panic disorder (episodic paroxysmal anxiety) 09/14/2016   Pneumonia    Sleep apnea    Trochanteric bursitis of right hip 05/17/2018   Past Surgical History:  Past Surgical History:  Procedure Laterality Date   CHOLECYSTECTOMY     COLONOSCOPY     Dr Rayfield Citizen around 2016 said it was normal   Social History:  reports that he has never smoked. He quit smokeless tobacco use about 4 years ago.  His smokeless tobacco use included snuff. He reports current alcohol use. He reports that he does not use drugs. Family History:  Family History  Problem Relation Age of Onset   Diabetes Mother    Diabetes Father    Leukemia Father    Skin cancer Father    Heart attack Father    Atrial fibrillation Father    Lung cancer Maternal Grandfather    Heart attack Paternal Grandmother    Leukemia Paternal Grandmother    Stroke Paternal Grandfather    Leukemia Paternal Grandfather    GER disease Sister    Arthritis Sister    Colon cancer Neg Hx    Esophageal cancer Neg Hx    Stomach cancer Neg Hx      HOME MEDICATIONS: Allergies as of 06/22/2023  Reactions   Jardiance [empagliflozin]    Metformin Diarrhea   Ozempic (0.25 Or 0.5 Mg-dose) [semaglutide(0.25 Or 0.5mg -dos)] Nausea Only   Penicillins Hives, Itching   Prednisone    Elevates blood surgar, increased heart rate   Ertugliflozin Rash   Lovastatin Rash        Medication List        Accurate as of June 21, 2023  2:34 PM. If you have any questions, ask your nurse or doctor.          bisacodyl 5 MG EC tablet Commonly known as: DULCOLAX Take 1 tablet (5 mg total) by mouth 2 (two) times daily.   dapagliflozin propanediol 10 MG Tabs tablet Commonly known as: Farxiga Take 1 tablet (10 mg total) by mouth daily.   diclofenac 75  MG EC tablet Commonly known as: VOLTAREN Take 1 tablet (75 mg total) by mouth 2 (two) times daily.   famciclovir 500 MG tablet Commonly known as: FAMVIR Take 1 tablet (500 mg total) by mouth 3 (three) times daily.   glipiZIDE 5 MG tablet Commonly known as: GLUCOTROL Take 2 tablets (10 mg total) by mouth daily before breakfast AND 1.5 tablets (7.5 mg total) daily before supper.   levocetirizine 5 MG tablet Commonly known as: XYZAL Take 1 tablet (5 mg total) by mouth every evening.   meloxicam 7.5 MG tablet Commonly known as: Mobic Take 1 tablet (7.5 mg total) by mouth 2 (two) times daily as needed.   omeprazole 40 MG capsule Commonly known as: PRILOSEC TAKE ONE CAPSULE BY MOUTH DAILY   rosuvastatin 10 MG tablet Commonly known as: CRESTOR TAKE ONE TABLET BY MOUTH DAILY   sodium polystyrene 15 GM/60ML suspension Commonly known as: KAYEXALATE 60 ml twice daily for 3 days         OBJECTIVE:   Vital Signs: There were no vitals taken for this visit.  Wt Readings from Last 3 Encounters:  03/17/23 196 lb (88.9 kg)  03/13/23 195 lb (88.5 kg)  02/01/23 196 lb (88.9 kg)     Exam: General: Pt appears well and is in NAD  Lungs: Clear with good BS bilat with no rales, rhonchi, or wheezes  Heart: RRR   Abdomen: Normoactive bowel sounds, soft, nontender, without masses or organomegaly palpable  Extremities: No pretibial edema.   Neuro: MS is good with appropriate affect, pt is alert and Ox3     DM foot exam: 01/29/2022   The skin of the feet is intact without sores or ulcerations. The pedal pulses are 1+ on right and 1 + on left. The sensation is intact to a screening 5.07, 10 gram monofilament bilaterally      DATA REVIEWED:  Lab Results  Component Value Date   HGBA1C 8.3 (A) 10/23/2022   HGBA1C 6.9 (A) 01/29/2022   HGBA1C 8.5 (H) 10/29/2021    Latest Reference Range & Units 03/17/23 08:48  Sodium 135 - 145 mEq/L 138  Potassium 3.5 - 5.1 mEq/L 4.7  Chloride  96 - 112 mEq/L 101  CO2 19 - 32 mEq/L 30  Glucose 70 - 99 mg/dL 191 (H)  BUN 6 - 23 mg/dL 17  Creatinine 4.78 - 2.95 mg/dL 6.21  Calcium 8.4 - 30.8 mg/dL 9.6  Alkaline Phosphatase 39 - 117 U/L 58  Albumin 3.5 - 5.2 g/dL 4.5  AST 0 - 37 U/L 20  ALT 0 - 53 U/L 33  Total Protein 6.0 - 8.3 g/dL 6.6  Total Bilirubin 0.2 - 1.2 mg/dL 0.9  GFR >60.00 mL/min 104.00  Total CHOL/HDL Ratio  3  Cholesterol 0 - 200 mg/dL 782  HDL Cholesterol >95.62 mg/dL 13.08 (L)  LDL (calc) 0 - 99 mg/dL 60  NonHDL  65.78  Triglycerides 0.0 - 149.0 mg/dL 46.9  VLDL 0.0 - 62.9 mg/dL 52.8    Latest Reference Range & Units 10/23/22 11:22  Creatinine,U mg/dL 41.3  Microalb, Ur 0.0 - 1.9 mg/dL <2.4  MICROALB/CREAT RATIO 0.0 - 30.0 mg/g 1.5     ASSESSMENT / PLAN / RECOMMENDATIONS:   1) Type 2 Diabetes Mellitus, Poorly Controlled, Without complications - Most recent A1c of 8.3  %. Goal A1c < 7.0 %.    -A1c has trended up from 6.9% to 8.3% -The patient had glucocorticoids over the past month due to either URI and shoulder  capsulitis, he also admits to dietary indiscretion when he was not feeling very well -We will increase glipizide during suppertime as below -Patient is motivated to increase physical activity and follow a low-carb diet - Intolerant to Metformin and GLP-1 agonists      MEDICATIONS: -   Change glipizide 5 mg, 2 tablets before Breakfast and continue 1.5 tablets before supper  -Continue Farxiga 10 mg daily   EDUCATION / INSTRUCTIONS: BG monitoring instructions: Patient is instructed to check his blood sugars 1 times a day Call [facility] if: BG persistently < 70  I reviewed the Rule of 15 for the treatment of hypoglycemia in detail with the patient. Literature supplied.    2) Diabetic complications:  Eye: Does not have known diabetic retinopathy.  Neuro/ Feet: Does not have known diabetic peripheral neuropathy .  Renal: Patient does not have known baseline CKD. He    Is not  on an ACEI/ARB at present.    3) Dyslipidemia:  -LDL, HDL, and TG are optimal  Medication Continue rosuvastatin 10 mg daily   F/U in 6 months    Signed electronically by: Lyndle Herrlich, MD  [facility] Endocrinology  [facility] [address] [place] [place], [place] [address] Phone: [phone] FAX: [phone]   CC: [name] [address] [place] [address] Phone: [phone]  Fax: [phone]  Return to Endocrinology clinic as below: Future Appointments  Date Time Provider Department Center  06/22/2023  7:50 AM [place] Savini, Konrad Dolores, MD [id number] None

## 2023-06-22 ENCOUNTER — Encounter: Payer: Self-pay | Admitting: Internal Medicine

## 2023-06-22 ENCOUNTER — Ambulatory Visit (INDEPENDENT_AMBULATORY_CARE_PROVIDER_SITE_OTHER): Payer: BC Managed Care – PPO | Admitting: Internal Medicine

## 2023-06-22 VITALS — BP 130/80 | HR 71 | Ht 70.0 in | Wt 201.8 lb

## 2023-06-22 DIAGNOSIS — Z7984 Long term (current) use of oral hypoglycemic drugs: Secondary | ICD-10-CM | POA: Diagnosis not present

## 2023-06-22 DIAGNOSIS — R300 Dysuria: Secondary | ICD-10-CM | POA: Diagnosis not present

## 2023-06-22 DIAGNOSIS — E119 Type 2 diabetes mellitus without complications: Secondary | ICD-10-CM | POA: Diagnosis not present

## 2023-06-22 DIAGNOSIS — R7989 Other specified abnormal findings of blood chemistry: Secondary | ICD-10-CM

## 2023-06-22 LAB — POCT GLYCOSYLATED HEMOGLOBIN (HGB A1C): Hemoglobin A1C: 8.7 % — AB (ref 4.0–5.6)

## 2023-06-22 LAB — GLUCOSE, POCT (MANUAL RESULT ENTRY): POC Glucose: 170 mg/dL — AB (ref 70–99)

## 2023-06-22 NOTE — Patient Instructions (Signed)
-   Continue  farxiga 10 mg , 1 tablet daily  - Change Glipizide 10 mg, 1 tablets before Breakfast and 1 tablets before supper       HOW TO TREAT LOW BLOOD SUGARS (Blood sugar LESS THAN 70 MG/DL) Please follow the RULE OF 15 for the treatment of hypoglycemia treatment (when your (blood sugars are less than 70 mg/dL)   STEP 1: Take 15 grams of carbohydrates when your blood sugar is low, which includes:  3-4 GLUCOSE TABS  OR 3-4 OZ OF JUICE OR REGULAR SODA OR ONE TUBE OF GLUCOSE GEL    STEP 2: RECHECK blood sugar in 15 MINUTES STEP 3: If your blood sugar is still low at the 15 minute recheck --> then, go back to STEP 1 and treat AGAIN with another 15 grams of carbohydrates.

## 2023-06-23 LAB — URINALYSIS W MICROSCOPIC + REFLEX CULTURE
Bacteria, UA: NONE SEEN /[HPF]
Bilirubin Urine: NEGATIVE
Hgb urine dipstick: NEGATIVE
Hyaline Cast: NONE SEEN /[LPF]
Ketones, ur: NEGATIVE
Leukocyte Esterase: NEGATIVE
Nitrites, Initial: NEGATIVE
Protein, ur: NEGATIVE
RBC / HPF: NONE SEEN /[HPF] (ref 0–2)
Specific Gravity, Urine: 1.022 (ref 1.001–1.035)
Squamous Epithelial / HPF: NONE SEEN /[HPF] (ref <=5)
WBC, UA: NONE SEEN /[HPF] (ref 0–5)
pH: 7 (ref 5.0–8.0)

## 2023-06-23 LAB — NO CULTURE INDICATED

## 2023-06-26 LAB — TESTOSTERONE, FREE: TESTOSTERONE FREE: 46.5 pg/mL (ref 46.0–224.0)

## 2023-06-26 LAB — PROLACTIN: Prolactin: 2.7 ng/mL (ref 2.0–18.0)

## 2023-06-26 LAB — LUTEINIZING HORMONE: LH: 5.4 m[IU]/mL (ref 1.5–9.3)

## 2023-06-29 ENCOUNTER — Telehealth: Payer: Self-pay | Admitting: Internal Medicine

## 2023-06-29 NOTE — Telephone Encounter (Signed)
Can you please check with the phlebotomist and see if they are going to result total testosterone with this free testosterone?    The order says there should also be total testosterone but I just want to make sure   Thanks

## 2023-07-06 ENCOUNTER — Other Ambulatory Visit: Payer: Self-pay | Admitting: Medical

## 2023-08-03 DIAGNOSIS — W000XXA Fall on same level due to ice and snow, initial encounter: Secondary | ICD-10-CM | POA: Diagnosis not present

## 2023-08-03 DIAGNOSIS — M4302 Spondylolysis, cervical region: Secondary | ICD-10-CM | POA: Diagnosis not present

## 2023-08-10 ENCOUNTER — Other Ambulatory Visit: Payer: Self-pay | Admitting: Medical

## 2023-08-13 ENCOUNTER — Other Ambulatory Visit: Payer: Self-pay | Admitting: Medical

## 2023-08-15 ENCOUNTER — Encounter: Payer: Self-pay | Admitting: Internal Medicine

## 2023-08-16 ENCOUNTER — Other Ambulatory Visit: Payer: Self-pay

## 2023-08-16 MED ORDER — GLIPIZIDE 5 MG PO TABS: ORAL_TABLET | ORAL | 1 refills | Status: DC

## 2023-08-19 ENCOUNTER — Other Ambulatory Visit: Payer: Self-pay | Admitting: Medical

## 2023-08-25 DIAGNOSIS — H11153 Pinguecula, bilateral: Secondary | ICD-10-CM | POA: Diagnosis not present

## 2023-08-25 DIAGNOSIS — H52223 Regular astigmatism, bilateral: Secondary | ICD-10-CM | POA: Diagnosis not present

## 2023-08-25 DIAGNOSIS — H524 Presbyopia: Secondary | ICD-10-CM | POA: Diagnosis not present

## 2023-08-25 DIAGNOSIS — E119 Type 2 diabetes mellitus without complications: Secondary | ICD-10-CM | POA: Diagnosis not present

## 2023-08-25 DIAGNOSIS — H04123 Dry eye syndrome of bilateral lacrimal glands: Secondary | ICD-10-CM | POA: Diagnosis not present

## 2023-08-25 DIAGNOSIS — H5202 Hypermetropia, left eye: Secondary | ICD-10-CM | POA: Diagnosis not present

## 2023-08-25 LAB — HM DIABETES EYE EXAM

## 2023-08-30 ENCOUNTER — Encounter: Payer: Self-pay | Admitting: Medical

## 2023-08-30 MED ORDER — OSELTAMIVIR PHOSPHATE 75 MG PO CAPS: 75.0000 mg | ORAL_CAPSULE | Freq: Two times a day (BID) | ORAL | 0 refills | Status: DC

## 2023-09-20 ENCOUNTER — Other Ambulatory Visit: Payer: Self-pay | Admitting: Medical

## 2023-10-21 ENCOUNTER — Ambulatory Visit: Payer: BC Managed Care – PPO | Admitting: Internal Medicine

## 2023-11-06 ENCOUNTER — Other Ambulatory Visit: Payer: Self-pay | Admitting: Medical

## 2023-11-06 ENCOUNTER — Other Ambulatory Visit: Payer: Self-pay | Admitting: Internal Medicine

## 2023-11-23 ENCOUNTER — Other Ambulatory Visit: Payer: Self-pay | Admitting: Medical

## 2023-12-18 DIAGNOSIS — R3915 Urgency of urination: Secondary | ICD-10-CM | POA: Diagnosis not present

## 2023-12-18 DIAGNOSIS — N3001 Acute cystitis with hematuria: Secondary | ICD-10-CM | POA: Diagnosis not present

## 2023-12-18 DIAGNOSIS — R300 Dysuria: Secondary | ICD-10-CM | POA: Diagnosis not present

## 2023-12-20 ENCOUNTER — Emergency Department (HOSPITAL_COMMUNITY)

## 2023-12-20 ENCOUNTER — Emergency Department (HOSPITAL_BASED_OUTPATIENT_CLINIC_OR_DEPARTMENT_OTHER)
Admission: EM | Admit: 2023-12-20 | Discharge: 2023-12-20 | Disposition: A | Discharge status: Home / Self Care | Attending: Emergency Medicine | Admitting: Emergency Medicine

## 2023-12-20 ENCOUNTER — Emergency Department (HOSPITAL_BASED_OUTPATIENT_CLINIC_OR_DEPARTMENT_OTHER)

## 2023-12-20 ENCOUNTER — Encounter (HOSPITAL_BASED_OUTPATIENT_CLINIC_OR_DEPARTMENT_OTHER): Payer: Self-pay | Admitting: Emergency Medicine

## 2023-12-20 ENCOUNTER — Other Ambulatory Visit: Payer: Self-pay

## 2023-12-20 DIAGNOSIS — N451 Epididymitis: Secondary | ICD-10-CM | POA: Insufficient documentation

## 2023-12-20 DIAGNOSIS — N3001 Acute cystitis with hematuria: Secondary | ICD-10-CM | POA: Insufficient documentation

## 2023-12-20 DIAGNOSIS — R509 Fever, unspecified: Secondary | ICD-10-CM | POA: Diagnosis not present

## 2023-12-20 DIAGNOSIS — N200 Calculus of kidney: Secondary | ICD-10-CM | POA: Diagnosis not present

## 2023-12-20 DIAGNOSIS — K4020 Bilateral inguinal hernia, without obstruction or gangrene, not specified as recurrent: Secondary | ICD-10-CM | POA: Diagnosis not present

## 2023-12-20 DIAGNOSIS — N433 Hydrocele, unspecified: Secondary | ICD-10-CM | POA: Diagnosis not present

## 2023-12-20 DIAGNOSIS — R109 Unspecified abdominal pain: Secondary | ICD-10-CM | POA: Diagnosis not present

## 2023-12-20 LAB — URINALYSIS, ROUTINE W REFLEX MICROSCOPIC
Bilirubin Urine: NEGATIVE
Glucose, UA: >=500 mg/dL — AB
Ketones, ur: 40 mg/dL — AB
Leukocytes,Ua: NEGATIVE
Nitrite: POSITIVE — AB
Protein, ur: NEGATIVE mg/dL
Specific Gravity, Urine: 1.01 (ref 1.005–1.030)
pH: 5 (ref 5.0–8.0)

## 2023-12-20 LAB — URINALYSIS, MICROSCOPIC (REFLEX)

## 2023-12-20 MED ORDER — LEVOFLOXACIN 500 MG PO TABS: 500.0000 mg | ORAL_TABLET | Freq: Every day | ORAL | 0 refills | Status: DC

## 2023-12-20 MED ORDER — KETOROLAC TROMETHAMINE 60 MG/2ML IM SOLN
30.0000 mg | Freq: Once | INTRAMUSCULAR | Status: AC
  Administered 2023-12-20: 30 mg via INTRAMUSCULAR
  Filled 2023-12-20: qty 2

## 2023-12-20 MED ORDER — SULFAMETHOXAZOLE-TRIMETHOPRIM 800-160 MG PO TABS
1.0000 | ORAL_TABLET | Freq: Once | ORAL | Status: AC
  Administered 2023-12-20: 1 via ORAL
  Filled 2023-12-20: qty 1

## 2023-12-20 MED ORDER — SULFAMETHOXAZOLE-TRIMETHOPRIM 800-160 MG PO TABS: 1.0000 | ORAL_TABLET | Freq: Two times a day (BID) | ORAL | 0 refills | Status: DC

## 2023-12-20 MED ORDER — HYDROCODONE-ACETAMINOPHEN 5-325 MG PO TABS: 1.0000 | ORAL_TABLET | Freq: Four times a day (QID) | ORAL | 0 refills | Status: AC | PRN

## 2023-12-20 MED ORDER — HYDROCODONE-ACETAMINOPHEN 5-325 MG PO TABS
1.0000 | ORAL_TABLET | Freq: Once | ORAL | Status: AC
  Administered 2023-12-20: 1 via ORAL
  Filled 2023-12-20: qty 1

## 2023-12-20 NOTE — ED Notes (Addendum)
 Patient/family informed of POV transfer to [facility] for continued care. Patient instructed to go directly to [facility] ED without detour. Patient instructed to remain NPO during transport. Patient verbalized understanding of instructions. Patients SO's to drive patient.   Report given to Bridgette Campus, Charge RN [facility].  Patient alert, ambulatory to ED exit without difficulty.

## 2023-12-20 NOTE — ED Provider Notes (Signed)
  Physical Exam  BP 122/73 (BP Location: Left Arm)   Pulse 83   Temp 98.8 F (37.1 C) (Oral)   Resp 17   Ht 5\' 10"  (1.778 m)   Wt 90.7 kg   SpO2 95%   BMI 28.70 kg/m   Physical Exam Vitals and nursing note reviewed.  Constitutional:      General: He is not in acute distress.    Appearance: He is not toxic-appearing.  HENT:     Head: Normocephalic and atraumatic.  Pulmonary:     Effort: No respiratory distress.  Skin:    Coloration: Skin is not jaundiced or pale.  Neurological:     Mental Status: He is alert and oriented to person, place, and time.  Psychiatric:        Behavior: Behavior normal.     Procedures  Procedures  ED Course / MDM    Medical Decision Making Amount and/or Complexity of Data Reviewed Labs: ordered. Radiology: ordered.  Risk Prescription drug management.   58 year old male transferred from outside hospital due to left-sided testicular swelling.  In short, 58 year old male who presented initially to urgent care and was diagnosed with UTI due to 2 to 3 days of dysuria.  Patient presented to outside hospital today due to fever and new onset left-sided testicular swelling.  The patient was sent in from outside hospital due to the fact that outside hospital does not have ultrasound capabilities.  Sent in to rule out torsion.  Patient ultrasound shows acute left-sided epididymitis.  Patient will be placed on levofloxacin 500 mg once a day for 10 days.  Will be given urology follow-up.  Will also send home with a small amount of pain medication.  Patient was given return precautions and he voiced understanding.  He was advised that he will need to follow-up with urology and he voiced understanding.  He was advised to take ibuprofen and Tylenol for pain control and if this does not control pain, proceed to hydrocodone.  Advised not to mix with alcohol, drive or operate machinery.  He voiced understanding.  He is stable to discharge home.     Adel Aden, PA-C 12/20/23 0433    Lindle Rhea, MD 12/20/23 [phone]

## 2023-12-20 NOTE — ED Provider Notes (Signed)
 [facility] Provider Note   CSN: [id number] Arrival date & time: 12/20/23  0103     History  Chief Complaint  Patient presents with   Fever    Brian Pittman is a 58 y.o. male.  The history is provided by the patient.  Fever Max temp prior to arrival:  100.5 Temp source:  Oral Severity:  Mild Onset quality:  Sudden Duration:  3 days Progression:  Waxing and waning Chronicity:  New Relieved by:  Nothing Risk factors: no sick contacts   Patient seen at urgent care yesterday for fever and UTI symptoms, penile burning and started on Macrobid.  Now having left testicular pain and swelling.       Home Medications Prior to Admission medications   Medication Sig Start Date End Date Taking? Authorizing Provider  sulfamethoxazole-trimethoprim (BACTRIM DS) 800-160 MG tablet Take 1 tablet by mouth 2 (two) times daily for 14 days. 12/20/23 01/03/24 Yes Lorice Lafave, MD  bisacodyl  (DULCOLAX) 5 MG EC tablet Take 1 tablet (5 mg total) by mouth 2 (two) times daily. 03/13/23   Thomes Flicker, PA  dapagliflozin  propanediol (FARXIGA ) 10 MG TABS tablet Take 1 tablet (10 mg total) by mouth daily. 11/09/23   Shamleffer, Ibtehal Jaralla, MD  diclofenac  (VOLTAREN ) 75 MG EC tablet Take 1 tablet (75 mg total) by mouth 2 (two) times daily. 02/01/23   Hudnall, Fabienne Holter, MD  famciclovir  (FAMVIR ) 500 MG tablet Take 1 tablet (500 mg total) by mouth 3 (three) times daily. 10/29/21   Saguier, Gaylin Ke, PA-C  glipiZIDE  (GLUCOTROL ) 5 MG tablet Glipizide  5 mg, 2 tablets before Breakfast and 2 tablets before supper 08/16/23   Shamleffer, Ibtehal Jaralla, MD  levocetirizine (XYZAL ) 5 MG tablet Take 1 tablet (5 mg total) by mouth every evening. 05/04/23   Saguier, Gaylin Ke, PA-C  meloxicam  (MOBIC ) 7.5 MG tablet Take 1 tablet (7.5 mg total) by mouth 2 (two) times daily as needed. 05/21/21   Margaree Shark, MD  omeprazole  (PRILOSEC) 40 MG capsule TAKE ONE CAPSULE BY MOUTH DAILY 11/23/23    Saguier, Gaylin Ke, PA-C  oseltamivir  (TAMIFLU ) 75 MG capsule Take 1 capsule (75 mg total) by mouth 2 (two) times daily. 08/30/23   Saguier, Gaylin Ke, PA-C  rosuvastatin  (CRESTOR ) 10 MG tablet TAKE ONE TABLET BY MOUTH DAILY 11/08/23   Saguier, Gaylin Ke, PA-C  sodium polystyrene (KAYEXALATE ) 15 GM/60ML suspension 60 ml twice daily for 3 days 06/29/20   Saguier, Gaylin Ke, PA-C      Allergies    Jardiance [empagliflozin], Metformin, Ozempic (0.25 or 0.5 mg-dose) [semaglutide(0.25 or 0.5mg -dos)], Penicillins, Prednisone, Ertugliflozin, and Lovastatin    Review of Systems   Review of Systems  Constitutional:  Positive for fever.  Respiratory:  Negative for wheezing and stridor.   Genitourinary:  Positive for penile pain, scrotal swelling and testicular pain. Negative for penile discharge.  All other systems reviewed and are negative.   Physical Exam Updated Vital Signs BP 115/80   Pulse 88   Temp (!) 100.5 F (38.1 C) (Oral)   Resp 20   Ht 5\' 10"  (1.778 m)   Wt 90.7 kg   SpO2 96%   BMI 28.70 kg/m  Physical Exam Vitals and nursing note reviewed. Exam conducted with a chaperone present.  Constitutional:      General: He is not in acute distress.    Appearance: He is well-developed. He is not diaphoretic.  HENT:     Head: Normocephalic and atraumatic.  Eyes:  Conjunctiva/sclera: Conjunctivae normal.     Pupils: Pupils are equal, round, and reactive to light.  Cardiovascular:     Rate and Rhythm: Normal rate and regular rhythm.  Pulmonary:     Effort: Pulmonary effort is normal.     Breath sounds: Normal breath sounds. No wheezing or rales.  Abdominal:     General: Bowel sounds are normal.     Palpations: Abdomen is soft.     Tenderness: There is no abdominal tenderness. There is no guarding or rebound.     Hernia: There is no hernia in the left inguinal area or right inguinal area.  Genitourinary:    Penis: Normal.      Testes:        Left: Tenderness and swelling present.  Cremasteric reflex is present.   Musculoskeletal:        General: Normal range of motion.     Cervical back: Normal range of motion and neck supple.  Skin:    General: Skin is warm and dry.  Neurological:     Mental Status: He is alert and oriented to person, place, and time.     ED Results / Procedures / Treatments   Labs (all labs ordered are listed, but only abnormal results are displayed) Labs Reviewed  URINALYSIS, ROUTINE W REFLEX MICROSCOPIC - Abnormal; Notable for the following components:      Result Value   Color, Urine ORANGE (*)    Glucose, UA >=500 (*)    Hgb urine dipstick TRACE (*)    Ketones, ur 40 (*)    Nitrite POSITIVE (*)    All other components within normal limits  URINALYSIS, MICROSCOPIC (REFLEX) - Abnormal; Notable for the following components:   Bacteria, UA RARE (*)    All other components within normal limits    EKG None  Radiology CT Renal Stone Study Result Date: 12/20/2023 EXAM: CT ABDOMEN AND PELVIS WITHOUT CONTRAST 12/20/2023 02:38:00 AM TECHNIQUE: CT of the abdomen and pelvis was performed without the administration of intravenous contrast. Multiplanar reformatted images are provided for review. Automated exposure control, iterative reconstruction, and/or weight based adjustment of the mA/kV was utilized to reduce the radiation dose to as low as reasonably achievable. COMPARISON: 03/13/2023 CLINICAL HISTORY: Abdominal/flank pain, stone suspected. Reports fever since Friday. Pt was seen yesterday at [facility] and treated for probable UTI, he states he has been taking antibiotics for this and symptoms have been worsening. He also mentions left testicle is swollen and painful. Has been on Macrobid for this since yesterday. He states "penis was burning" intermittently for a while. Pt unable to quantify time. FINDINGS: LOWER CHEST: No acute abnormality. LIVER: The liver is unremarkable. GALLBLADDER AND BILE DUCTS: Status post cholecystectomy. No biliary ductal  dilatation. SPLEEN: No acute abnormality. PANCREAS: No acute abnormality. ADRENAL GLANDS: No acute abnormality. KIDNEYS, URETERS AND BLADDER: 3 mm nonobstructing left lower pole renal calculus. No hydronephrosis. No perinephric or periureteral stranding. Urinary bladder is unremarkable. GI AND BOWEL: Mild to moderate right colonic stool burden. Normal diminutive appendix. No bowel obstruction. PERITONEUM AND RETROPERITONEUM: No ascites. No free air. VASCULATURE: Aorta is normal in caliber. LYMPH NODES: No lymphadenopathy. REPRODUCTIVE ORGANS: No acute abnormality. BONES AND SOFT TISSUES: Mild degenerative changes of the lower thoracic / upper lumbar spine. Tiny fat-containing bilateral inguinal hernias. No acute osseous abnormality. IMPRESSION: 1. 3 mm nonobstructing left lower pole renal calculus. No hydronephrosis. Electronically signed by: Zadie Herter MD 12/20/2023 02:41 AM EDT RP Workstation: [id number]    Procedures Procedures  Medications Ordered in ED Medications  ketorolac  (TORADOL ) injection 30 mg (30 mg Intramuscular Given 12/20/23 0217)  sulfamethoxazole-trimethoprim (BACTRIM DS) 800-160 MG per tablet 1 tablet (1 tablet Oral Given 12/20/23 0217)    ED Course/ Medical Decision Making/ A&P                                 Medical Decision Making Patient with fever and penile burning and now with 1 day of testicle swelling and pain, on macrobid   Amount and/or Complexity of Data Reviewed Independent Historian: spouse    Details: See above  Labs: ordered.    Details: Urine is consistent with UTI Radiology: ordered and independent interpretation performed.    Details: No ureteral stones. US  of the testicle to exclude torsion is ordered  Discussion of management or test interpretation with external provider(s): Dr. Wallis Gun to accept ED to ED, as we do not have US  here   Risk Prescription drug management. Risk Details: Change antibiotics to bactrim Ds for 14 days following  discussion with pharmacy will have patient follow up with urology for ongoing care.  Stable for transfer     Final Clinical Impression(s) / ED Diagnoses Final diagnoses:  Pain in left testicle  Acute cystitis with hematuria   Transfer to [facility] for US  of testicle  Rx / DC Orders ED Discharge Orders          Ordered    sulfamethoxazole-trimethoprim (BACTRIM DS) 800-160 MG tablet  2 times daily        12/20/23 0243              Rhoda Waldvogel, MD 12/20/23 0865

## 2023-12-20 NOTE — ED Triage Notes (Signed)
 Reports fever since Friday. Pt was seen yesterday at [facility] and tx for probable UTI, he states he has been taking abx for this and sx have been worsening. He also mentions L testicle is swollen and painful. Has been on Macrobid for this since yesterday. He states "penis was burning" intermittently for a while. Pt unable to quantify time.

## 2023-12-20 NOTE — Discharge Instructions (Addendum)
 It was a pleasure taking part in your care.  As discussed, you have left-sided epididymitis.  This is inflammation and infection of the epididymis.  Please begin taking levofloxacin 500 mg once a day for 10 days.  Please follow-up with [facility].  Please call their office in the morning and make an appointment to be seen.  You may take ibuprofen or Tylenol for pain control and if this does not relieve pain, please proceed to taking 1 tablet of hydrocodone pain medication every 6 hours.  Do not mix this with alcohol, do not drive or operate machinery while taking this.  Return to the ED with any new or worsening symptoms.  Please read the attached guide concerning epididymitis.

## 2023-12-21 ENCOUNTER — Ambulatory Visit: Admitting: Internal Medicine

## 2024-01-03 ENCOUNTER — Other Ambulatory Visit: Payer: Self-pay | Admitting: Medical

## 2024-01-17 ENCOUNTER — Encounter: Payer: Self-pay | Admitting: Internal Medicine

## 2024-01-17 ENCOUNTER — Ambulatory Visit (INDEPENDENT_AMBULATORY_CARE_PROVIDER_SITE_OTHER): Admitting: Internal Medicine

## 2024-01-17 VITALS — BP 124/72 | HR 85 | Ht 70.0 in | Wt 193.2 lb

## 2024-01-17 DIAGNOSIS — E875 Hyperkalemia: Secondary | ICD-10-CM | POA: Diagnosis not present

## 2024-01-17 DIAGNOSIS — E785 Hyperlipidemia, unspecified: Secondary | ICD-10-CM

## 2024-01-17 DIAGNOSIS — G63 Polyneuropathy in diseases classified elsewhere: Secondary | ICD-10-CM | POA: Diagnosis not present

## 2024-01-17 DIAGNOSIS — E119 Type 2 diabetes mellitus without complications: Secondary | ICD-10-CM | POA: Diagnosis not present

## 2024-01-17 DIAGNOSIS — Z7984 Long term (current) use of oral hypoglycemic drugs: Secondary | ICD-10-CM

## 2024-01-17 LAB — POCT GLYCOSYLATED HEMOGLOBIN (HGB A1C): Hemoglobin A1C: 8.1 % — AB (ref 4.0–5.6)

## 2024-01-17 MED ORDER — GABAPENTIN 300 MG PO CAPS: 300.0000 mg | ORAL_CAPSULE | Freq: Every day | ORAL | 3 refills | Status: AC

## 2024-01-17 MED ORDER — PIOGLITAZONE HCL 15 MG PO TABS: 15.0000 mg | ORAL_TABLET | Freq: Every day | ORAL | 3 refills | Status: AC

## 2024-01-17 NOTE — Progress Notes (Unsigned)
 Name: Brian Pittman  Age/ Sex: 58 y.o., male   MRN/ DOB: [id number], Sep 08, 1965     PCP: Dorina Dallas RIGGERS   Reason for Endocrinology Evaluation: Type 2 Diabetes Mellitus  Initial Endocrine Consultative Visit: 02/14/2020    PATIENT IDENTIFIER: [name] is a 58 y.o. male with a past medical history of T2DM and Dyslipidemia. The patient has followed with Endocrinology clinic since 02/14/2020 for consultative assistance with management of his diabetes.  DIABETIC HISTORY:  [name] was diagnosed with DM in 2015,metformin - GI side effects . Ozempic/Trulicity - sour stomach and gas . His hemoglobin A1c has ranged from 7.1% in 08/2019, peaking at 7.5% in 03/2020.  On his initial to our clinic he had an A1c of 7.1 %. He was on Amaryl and Qtern . We switched Glimepiride to Glipizide  and continued Qtern  but by 07/2020 Qtern  became cost prohibitve and started Farxiga     LOW TESTOSTERONE  HISTORY: During evaluation of fatigue, Pt was noted with low testosterone  at PCP's office 167 ng/dL in 07/7973. He was also noted with low SHBG .   Patient's LH, prolactin, and free testosterone  were normal 06/2023    SUBJECTIVE:   During the last visit (06/22/2023): A1c 8.7 %   Today (01/17/2024): [name] is here for a follow up on diabetes. [name]  He checks his blood sugars 2 times daily. The patient has not had hypoglycemic episodes since the last clinic visit.   Patient was treated for acute cystitis 12/20/2023, took reoved Abx which has helped  Has had minimal dysuria  Energy is stable  Denies nausea, vomiting Denies constipation or diarrhea  Has lower extremity discomfort worse at night starting at the back of the knee down, has cramps  with burning sensation   Has $5000 deductible   HOME DIABETES REGIMEN:  Glipizide  10 mg,  2 tabs before breakfast and 2 tab before supper Farxiga  10 mg daily     Statin: Yes ACE-I/ARB: no   GLUCOSE LOG:   AM 128- 188  PM 118-206    DIABETIC  COMPLICATIONS: Microvascular complications:   Denies: CKD, neuropathy, retinopathy  Last Eye Exam: Completed 2022 Dr. Camillo   Macrovascular complications:   Denies: CAD, CVA, PVD   HISTORY:  Past Medical History:  Past Medical History:  Diagnosis Date   Body mass index (BMI) 31.0-31.9, adult    DM2 (diabetes mellitus, type 2) (HCC)    Ear infection    Generalized anxiety disorder 06/28/2018   GERD (gastroesophageal reflux disease)    Hypercholesterolemia    Panic disorder (episodic paroxysmal anxiety) 09/14/2016   Pneumonia    Sleep apnea    Trochanteric bursitis of right hip 05/17/2018   Past Surgical History:  Past Surgical History:  Procedure Laterality Date   CHOLECYSTECTOMY     COLONOSCOPY     Dr Anette around 2016 said it was normal   Social History:  reports that he has never smoked. He quit smokeless tobacco use about 5 years ago.  His smokeless tobacco use included snuff. He reports current alcohol use. He reports that he does not use drugs. Family History:  Family History  Problem Relation Age of Onset   Diabetes Mother    Diabetes Father    Leukemia Father    Skin cancer Father    Heart attack Father    Atrial fibrillation Father    Lung cancer Maternal Grandfather    Heart attack Paternal Grandmother    Leukemia Paternal Grandmother    Stroke Paternal  Grandfather    Leukemia Paternal Grandfather    GER disease Sister    Arthritis Sister    Colon cancer Neg Hx    Esophageal cancer Neg Hx    Stomach cancer Neg Hx      HOME MEDICATIONS: Allergies as of 01/17/2024       Reactions   Jardiance [empagliflozin]    Metformin Diarrhea   Ozempic (0.25 Or 0.5 Mg-dose) [semaglutide(0.25 Or 0.5mg -dos)] Nausea Only   Penicillins Hives, Itching   Prednisone    Elevates blood surgar, increased heart rate   Ertugliflozin Rash   Lovastatin Rash        Medication List        Accurate as of January 17, 2024  7:48 AM. If you have any questions, ask your  nurse or doctor.          bisacodyl  5 MG EC tablet Commonly known as: DULCOLAX Take 1 tablet (5 mg total) by mouth 2 (two) times daily.   diclofenac  75 MG EC tablet Commonly known as: VOLTAREN  Take 1 tablet (75 mg total) by mouth 2 (two) times daily.   famciclovir  500 MG tablet Commonly known as: FAMVIR  Take 1 tablet (500 mg total) by mouth 3 (three) times daily.   Farxiga  10 MG Tabs tablet Generic drug: dapagliflozin  propanediol Take 1 tablet (10 mg total) by mouth daily.   glipiZIDE  5 MG tablet Commonly known as: GLUCOTROL  Glipizide  5 mg, 2 tablets before Breakfast and 2 tablets before supper   HYDROcodone -acetaminophen  5-325 MG tablet Commonly known as: NORCO/VICODIN Take 1 tablet by mouth every 6 (six) hours as needed.   levocetirizine 5 MG tablet Commonly known as: XYZAL  Take 1 tablet (5 mg total) by mouth every evening.   levofloxacin  500 MG tablet Commonly known as: LEVAQUIN  Take 1 tablet (500 mg total) by mouth daily.   meloxicam  7.5 MG tablet Commonly known as: Mobic  Take 1 tablet (7.5 mg total) by mouth 2 (two) times daily as needed.   omeprazole  40 MG capsule Commonly known as: PRILOSEC TAKE ONE CAPSULE BY MOUTH DAILY   oseltamivir  75 MG capsule Commonly known as: Tamiflu  Take 1 capsule (75 mg total) by mouth 2 (two) times daily.   rosuvastatin  10 MG tablet Commonly known as: CRESTOR  TAKE ONE TABLET BY MOUTH DAILY   sodium polystyrene 15 GM/60ML suspension Commonly known as: KAYEXALATE  60 ml twice daily for 3 days         OBJECTIVE:   Vital Signs: BP 124/72 (BP Location: Left Arm, Patient Position: Sitting, Cuff Size: Normal)   Pulse 85   Ht 5' 10 (1.778 m)   Wt 193 lb 3.2 oz (87.6 kg)   SpO2 99%   BMI 27.72 kg/m   Wt Readings from Last 3 Encounters:  01/17/24 193 lb 3.2 oz (87.6 kg)  12/20/23 200 lb (90.7 kg)  06/22/23 201 lb 12.8 oz (91.5 kg)     Exam: General: Pt appears well and is in NAD  Lungs: Clear with good BS bilat    Heart: RRR   Abdomen: soft, nontender  Extremities: No pretibial edema.   Neuro: MS is good with appropriate affect, pt is alert and Ox3     DM foot exam: 01/17/2024   The skin of the feet is intact without sores or ulcerations. The pedal pulses are 1+ on right and 1 + on left. The sensation is intact to a screening 5.07, 10 gram monofilament bilaterally      DATA REVIEWED:  Lab Results  Component Value Date   HGBA1C 8.1 (A) 01/17/2024   HGBA1C 8.7 (A) 06/22/2023   HGBA1C 8.3 (A) 10/23/2022     Latest Reference Range & Units 06/22/23 08:32  LH 1.5 - 9.3 mIU/mL 5.4  Prolactin 2.0 - 18.0 ng/mL 2.7     Latest Reference Range & Units 03/17/23 08:48  Sex Horm Binding Glob, Serum 22 - 77 nmol/L 17 (L)  Testosterone  250 - 827 ng/dL 832 (L)  Testosterone , Bioavailable 110.0 - 575.0 ng/dL Pend  Testosterone  Free 46.0 - 224.0 pg/mL See below  TSH 0.35 - 5.50 uIU/mL 4.22  T4,Free(Direct) 0.60 - 1.60 ng/dL 9.25  PSA 9.89 - 5.99 ng/mL 0.89   ASSESSMENT / PLAN / RECOMMENDATIONS:   1) Type 2 Diabetes Mellitus, Poorly Controlled, Without complications - Most recent A1c of 8.1 %. Goal A1c < 7.0 %.   -A1c has trended up, patient attributes this to dietary indiscretions [place] - Intolerant to Metformin and GLP-1 agonists , but I did entertain the idea of trying [facility], patient has a high deductible of $5000, and we opted to increase glipizide  instead.   MEDICATIONS: - Continue  glipizide  10 mg,2 tablet before Breakfast and 2 tablet before supper  - Continue Farxiga  10 mg daily - Start Pioglitazone 15 mg daily    EDUCATION / INSTRUCTIONS: BG monitoring instructions: Patient is instructed to check his blood sugars 1 times a day Call [facility] if: BG persistently < 70  I reviewed the Rule of 15 for the treatment of hypoglycemia in detail with the patient. Literature supplied.    2) Diabetic complications:  Eye: Does  not have known diabetic retinopathy.  Neuro/ Feet: Does not have known diabetic peripheral neuropathy .  Renal: Patient does not have known baseline CKD. He   Is not  on an ACEI/ARB at present.    3) Dyslipidemia:  -LDL, HDL, and TG are optimal  Medication Continue rosuvastatin  10 mg daily  4) Peripheral neuropathy :   - Patient with lower extremity discomfort, cramps that has been noted mostly at night, no claudication but has rare burning sensation - Will start gabapentin, caution against drowsiness/dizziness  Medication Start gabapentin 300 mg at bedtime   F/U in 4 months    Signed electronically by: Stefano Redgie Butts, MD  [facility]  [facility] [address] [place] [place], [place] [address] Phone: [phone] FAX: [phone]   CC: Dorina Dallas RIGGERS [address] [place] [address] Phone: [phone]  Fax: [phone]  Return to Endocrinology clinic as below: Future Appointments  Date Time Provider Department Center  01/17/2024  7:50 AM Harce Volden, Donell Redgie, MD [id number] None

## 2024-01-17 NOTE — Patient Instructions (Signed)
-   Continue  farxiga  10 mg , 1 tablet daily  - Continue  Glipizide  10 mg, 2 tablets before Breakfast and 2 tablets before supper  - Start pioglitazone 15 mg daily    Take gabapentin 300 mg, 1 tablet at bedtime      HOW TO TREAT LOW BLOOD SUGARS (Blood sugar LESS THAN 70 MG/DL) Please follow the RULE OF 15 for the treatment of hypoglycemia treatment (when your (blood sugars are less than 70 mg/dL)   STEP 1: Take 15 grams of carbohydrates when your blood sugar is low, which includes:  3-4 GLUCOSE TABS  OR 3-4 OZ OF JUICE OR REGULAR SODA OR ONE TUBE OF GLUCOSE GEL    STEP 2: RECHECK blood sugar in 15 MINUTES STEP 3: If your blood sugar is still low at the 15 minute recheck --> then, go back to STEP 1 and treat AGAIN with another 15 grams of carbohydrates.

## 2024-01-18 ENCOUNTER — Ambulatory Visit: Payer: Self-pay | Admitting: Internal Medicine

## 2024-01-18 DIAGNOSIS — G63 Polyneuropathy in diseases classified elsewhere: Secondary | ICD-10-CM | POA: Insufficient documentation

## 2024-01-18 DIAGNOSIS — E875 Hyperkalemia: Secondary | ICD-10-CM | POA: Insufficient documentation

## 2024-01-18 LAB — LIPID PANEL
Cholesterol: 175 mg/dL (ref <200)
HDL: 38 mg/dL — ABNORMAL LOW (ref >=40)
LDL Cholesterol (Calc): 108 mg/dL — ABNORMAL HIGH
Non-HDL Cholesterol (Calc): 137 mg/dL — ABNORMAL HIGH (ref <130)
Total CHOL/HDL Ratio: 4.6 (calc) (ref <5.0)
Triglycerides: 175 mg/dL — ABNORMAL HIGH (ref <150)

## 2024-01-18 LAB — BASIC METABOLIC PANEL WITH GFR
BUN/Creatinine Ratio: 28 (calc) — ABNORMAL HIGH (ref 6–22)
BUN: 17 mg/dL (ref 7–25)
CO2: 29 mmol/L (ref 20–32)
Calcium: 9.7 mg/dL (ref 8.6–10.3)
Chloride: 100 mmol/L (ref 98–110)
Creat: 0.61 mg/dL — ABNORMAL LOW (ref 0.70–1.30)
Glucose, Bld: 167 mg/dL — ABNORMAL HIGH (ref 65–99)
Potassium: 5.4 mmol/L — ABNORMAL HIGH (ref 3.5–5.3)
Sodium: 137 mmol/L (ref 135–146)
eGFR: 112 mL/min/{1.73_m2} (ref >=60)

## 2024-01-18 LAB — MICROALBUMIN / CREATININE URINE RATIO
Creatinine, Urine: 59 mg/dL (ref 20–320)
Microalb Creat Ratio: 7 mg/g{creat} (ref <30)
Microalb, Ur: 0.4 mg/dL

## 2024-01-18 LAB — VITAMIN D 25 HYDROXY (VIT D DEFICIENCY, FRACTURES): Vit D, 25-Hydroxy: 44 ng/mL (ref 30–100)

## 2024-01-18 LAB — MAGNESIUM: Magnesium: 2.4 mg/dL (ref 1.5–2.5)

## 2024-01-18 MED ORDER — ROSUVASTATIN CALCIUM 20 MG PO TABS: 20.0000 mg | ORAL_TABLET | Freq: Every day | ORAL | 3 refills | Status: AC

## 2024-01-18 MED ORDER — DAPAGLIFLOZIN PROPANEDIOL 5 MG PO TABS: 5.0000 mg | ORAL_TABLET | Freq: Every day | ORAL | 3 refills | Status: AC

## 2024-02-04 ENCOUNTER — Other Ambulatory Visit: Payer: Self-pay | Admitting: Internal Medicine

## 2024-02-14 ENCOUNTER — Other Ambulatory Visit: Payer: Self-pay | Admitting: Medical

## 2024-02-15 ENCOUNTER — Ambulatory Visit: Admitting: Internal Medicine

## 2024-02-27 DIAGNOSIS — E7800 Pure hypercholesterolemia, unspecified: Secondary | ICD-10-CM | POA: Diagnosis not present

## 2024-02-27 DIAGNOSIS — R197 Diarrhea, unspecified: Secondary | ICD-10-CM | POA: Diagnosis not present

## 2024-02-27 DIAGNOSIS — Z1152 Encounter for screening for COVID-19: Secondary | ICD-10-CM | POA: Diagnosis not present

## 2024-02-27 DIAGNOSIS — R509 Fever, unspecified: Secondary | ICD-10-CM | POA: Diagnosis not present

## 2024-02-27 DIAGNOSIS — R109 Unspecified abdominal pain: Secondary | ICD-10-CM | POA: Diagnosis not present

## 2024-02-27 DIAGNOSIS — K5289 Other specified noninfective gastroenteritis and colitis: Secondary | ICD-10-CM | POA: Diagnosis not present

## 2024-02-28 ENCOUNTER — Emergency Department (HOSPITAL_COMMUNITY)

## 2024-02-28 ENCOUNTER — Encounter (HOSPITAL_COMMUNITY): Payer: Self-pay

## 2024-02-28 ENCOUNTER — Other Ambulatory Visit: Payer: Self-pay

## 2024-02-28 ENCOUNTER — Emergency Department (HOSPITAL_COMMUNITY)
Admission: EM | Admit: 2024-02-28 | Discharge: 2024-02-28 | Disposition: A | Discharge status: Home / Self Care | Attending: Emergency Medicine | Admitting: Emergency Medicine

## 2024-02-28 DIAGNOSIS — Z9049 Acquired absence of other specified parts of digestive tract: Secondary | ICD-10-CM | POA: Diagnosis not present

## 2024-02-28 DIAGNOSIS — K529 Noninfective gastroenteritis and colitis, unspecified: Secondary | ICD-10-CM | POA: Diagnosis not present

## 2024-02-28 DIAGNOSIS — R109 Unspecified abdominal pain: Secondary | ICD-10-CM | POA: Diagnosis not present

## 2024-02-28 DIAGNOSIS — Z7984 Long term (current) use of oral hypoglycemic drugs: Secondary | ICD-10-CM | POA: Diagnosis not present

## 2024-02-28 DIAGNOSIS — N200 Calculus of kidney: Secondary | ICD-10-CM | POA: Diagnosis not present

## 2024-02-28 DIAGNOSIS — R1084 Generalized abdominal pain: Secondary | ICD-10-CM | POA: Diagnosis not present

## 2024-02-28 DIAGNOSIS — E119 Type 2 diabetes mellitus without complications: Secondary | ICD-10-CM | POA: Diagnosis not present

## 2024-02-28 DIAGNOSIS — N3289 Other specified disorders of bladder: Secondary | ICD-10-CM | POA: Diagnosis not present

## 2024-02-28 DIAGNOSIS — R197 Diarrhea, unspecified: Secondary | ICD-10-CM

## 2024-02-28 LAB — COMPREHENSIVE METABOLIC PANEL WITH GFR
ALT: 33 U/L (ref 0–44)
AST: 20 U/L (ref 15–41)
Albumin: 3.5 g/dL (ref 3.5–5.0)
Alkaline Phosphatase: 50 U/L (ref 38–126)
Anion gap: 13 (ref 5–15)
BUN: 15 mg/dL (ref 6–20)
CO2: 24 mmol/L (ref 22–32)
Calcium: 9.1 mg/dL (ref 8.9–10.3)
Chloride: 99 mmol/L (ref 98–111)
Creatinine, Ser: 0.71 mg/dL (ref 0.61–1.24)
GFR, Estimated: >60 mL/min (ref >60)
Glucose, Bld: 106 mg/dL — ABNORMAL HIGH (ref 70–99)
Potassium: 3.6 mmol/L (ref 3.5–5.1)
Sodium: 136 mmol/L (ref 135–145)
Total Bilirubin: 1 mg/dL (ref 0.0–1.2)
Total Protein: 6.5 g/dL (ref 6.5–8.1)

## 2024-02-28 LAB — CBG MONITORING, ED: Glucose-Capillary: 110 mg/dL — ABNORMAL HIGH (ref 70–99)

## 2024-02-28 LAB — CBC WITH DIFFERENTIAL/PLATELET
Abs Immature Granulocytes: 0.08 K/uL — ABNORMAL HIGH (ref 0.00–0.07)
Basophils Absolute: 0.1 K/uL (ref 0.0–0.1)
Basophils Relative: 1 %
Eosinophils Absolute: 0.1 K/uL (ref 0.0–0.5)
Eosinophils Relative: 1 %
HCT: 47.2 % (ref 39.0–52.0)
Hemoglobin: 16.3 g/dL (ref 13.0–17.0)
Immature Granulocytes: 2 %
Lymphocytes Relative: 27 %
Lymphs Abs: 1.4 K/uL (ref 0.7–4.0)
MCH: 30.1 pg (ref 26.0–34.0)
MCHC: 34.5 g/dL (ref 30.0–36.0)
MCV: 87.2 fL (ref 80.0–100.0)
Monocytes Absolute: 1.3 K/uL — ABNORMAL HIGH (ref 0.1–1.0)
Monocytes Relative: 24 %
Neutro Abs: 2.4 K/uL (ref 1.7–7.7)
Neutrophils Relative %: 45 %
Platelets: 329 K/uL (ref 150–400)
RBC: 5.41 MIL/uL (ref 4.22–5.81)
RDW: 12.5 % (ref 11.5–15.5)
WBC: 5.3 K/uL (ref 4.0–10.5)
nRBC: 0 % (ref 0.0–0.2)

## 2024-02-28 LAB — C DIFFICILE QUICK SCREEN W PCR REFLEX
C Diff antigen: NEGATIVE
C Diff interpretation: NOT DETECTED
C Diff toxin: NEGATIVE

## 2024-02-28 LAB — MAGNESIUM: Magnesium: 2 mg/dL (ref 1.7–2.4)

## 2024-02-28 LAB — LIPASE, BLOOD: Lipase: 40 U/L (ref 11–51)

## 2024-02-28 MED ORDER — SODIUM CHLORIDE 0.9 % IV BOLUS
1000.0000 mL | Freq: Once | INTRAVENOUS | Status: AC
  Administered 2024-02-28 (×2): 1000 mL via INTRAVENOUS

## 2024-02-28 MED ORDER — METRONIDAZOLE 500 MG PO TABS: 500.0000 mg | ORAL_TABLET | Freq: Two times a day (BID) | ORAL | 0 refills | Status: AC

## 2024-02-28 MED ORDER — METRONIDAZOLE 500 MG PO TABS
500.0000 mg | ORAL_TABLET | Freq: Once | ORAL | Status: AC
  Administered 2024-02-28 (×2): 500 mg via ORAL
  Filled 2024-02-28: qty 1

## 2024-02-28 MED ORDER — IOHEXOL 350 MG/ML SOLN
75.0000 mL | Freq: Once | INTRAVENOUS | Status: AC | PRN
  Administered 2024-02-28 (×2): 75 mL via INTRAVENOUS

## 2024-02-28 MED ORDER — CIPROFLOXACIN HCL 500 MG PO TABS
500.0000 mg | ORAL_TABLET | Freq: Once | ORAL | Status: AC
  Administered 2024-02-28 (×2): 500 mg via ORAL
  Filled 2024-02-28: qty 1

## 2024-02-28 MED ORDER — CIPROFLOXACIN HCL 500 MG PO TABS: 500.0000 mg | ORAL_TABLET | Freq: Two times a day (BID) | ORAL | 0 refills | Status: AC

## 2024-02-28 NOTE — ED Notes (Signed)
 Pt has a cup and a hat with instructions for a stool sample if able to provide. Verbalizes understanding.

## 2024-02-28 NOTE — ED Triage Notes (Signed)
 Patient c/o diarrhea since Thursday, denies food involvement. Fever on Saturday, none at this time. Patient reports frequent, liquid stool with burning abdominal pain.

## 2024-02-28 NOTE — ED Notes (Signed)
 Pt given some crackers and PB to go with his ABX.

## 2024-02-28 NOTE — ED Notes (Signed)
 Pt taken to CT.

## 2024-02-28 NOTE — ED Provider Notes (Signed)
 [place] EMERGENCY DEPARTMENT AT [facility] Provider Note   CSN: [id number] Arrival date & time: 02/28/24  1436     Patient presents with: Diarrhea   Brian Pittman is a 58 y.o. male.   Patient is here with diarrhea for the last several days.  Started after maybe some antibiotics for epididymitis.  He has had some abdominal discomfort.  History of diabetes.  Denies any black or bloody stools.  History of diabetes high cholesterol.  Nothing makes it worse or better.  Denies any fevers no chills.  Denies any weakness numbness tingling.  The history is provided by the patient.       Prior to Admission medications   Medication Sig Start Date End Date Taking? Authorizing Provider  ciprofloxacin  (CIPRO ) 500 MG tablet Take 1 tablet (500 mg total) by mouth every 12 (twelve) hours. 02/28/24  Yes Kenedy Haisley, DO  metroNIDAZOLE  (FLAGYL ) 500 MG tablet Take 1 tablet (500 mg total) by mouth 2 (two) times daily. 02/28/24  Yes Braxtin Bamba, DO  bisacodyl  (DULCOLAX) 5 MG EC tablet Take 1 tablet (5 mg total) by mouth 2 (two) times daily. 03/13/23   Ladora Congress, PA  dapagliflozin  propanediol (FARXIGA ) 5 MG TABS tablet Take 1 tablet (5 mg total) by mouth daily. 01/18/24   Shamleffer, Ibtehal Jaralla, MD  diclofenac  (VOLTAREN ) 75 MG EC tablet Take 1 tablet (75 mg total) by mouth 2 (two) times daily. 02/01/23   Hudnall, Ludie SAUNDERS, MD  gabapentin  (NEURONTIN ) 300 MG capsule Take 1 capsule (300 mg total) by mouth at bedtime. 01/17/24   Shamleffer, Ibtehal Jaralla, MD  glipiZIDE  (GLUCOTROL ) 5 MG tablet TAKE TWO TABLETS BY MOUTH BEFORE BREAKFAST AND TAKE TWO TABLETS BEFORE SUPPER 02/07/24   Shamleffer, Ibtehal Jaralla, MD  HYDROcodone -acetaminophen  (NORCO/VICODIN) 5-325 MG tablet Take 1 tablet by mouth every 6 (six) hours as needed. 12/20/23   Ruthell Lonni FALCON, PA-C  levocetirizine (XYZAL ) 5 MG tablet Take 1 tablet (5 mg total) by mouth every evening. 05/04/23   Saguier, Dallas, PA-C  meloxicam  (MOBIC ) 7.5 MG  tablet Take 1 tablet (7.5 mg total) by mouth 2 (two) times daily as needed. 05/21/21   Chick Venetia BRAVO, MD  omeprazole  (PRILOSEC) 40 MG capsule TAKE ONE CAPSULE BY MOUTH DAILY 02/14/24   Saguier, Dallas, PA-C  pioglitazone  (ACTOS ) 15 MG tablet Take 1 tablet (15 mg total) by mouth daily. 01/17/24   Shamleffer, Ibtehal Jaralla, MD  rosuvastatin  (CRESTOR ) 20 MG tablet Take 1 tablet (20 mg total) by mouth daily. 01/18/24   Shamleffer, Ibtehal Jaralla, MD  sodium polystyrene (KAYEXALATE ) 15 GM/60ML suspension 60 ml twice daily for 3 days 06/29/20   Saguier, Dallas, PA-C    Allergies: Jardiance [empagliflozin], Metformin, Ozempic (0.25 or 0.5 mg-dose) [semaglutide(0.25 or 0.5mg -dos)], Penicillins, Prednisone, Ertugliflozin, and Lovastatin    Review of Systems  Updated Vital Signs BP 127/83   Pulse 80   Temp 98.6 F (37 C) (Oral)   Resp 16   SpO2 100%   Physical Exam Vitals and nursing note reviewed.  Constitutional:      General: He is not in acute distress.    Appearance: He is well-developed. He is not ill-appearing.  HENT:     Head: Normocephalic and atraumatic.     Nose: Nose normal.     Mouth/Throat:     Mouth: Mucous membranes are moist.  Eyes:     Extraocular Movements: Extraocular movements intact.     Conjunctiva/sclera: Conjunctivae normal.     Pupils: Pupils are  equal, round, and reactive to light.  Cardiovascular:     Rate and Rhythm: Normal rate and regular rhythm.     Pulses: Normal pulses.     Heart sounds: Normal heart sounds. No murmur heard. Pulmonary:     Effort: Pulmonary effort is normal. No respiratory distress.     Breath sounds: Normal breath sounds.  Abdominal:     Palpations: Abdomen is soft.     Tenderness: There is abdominal tenderness.  Musculoskeletal:        General: No swelling.     Cervical back: Normal range of motion and neck supple.  Skin:    General: Skin is warm and dry.     Capillary Refill: Capillary refill takes less than 2 seconds.   Neurological:     General: No focal deficit present.     Mental Status: He is alert and oriented to person, place, and time.     Cranial Nerves: No cranial nerve deficit.     Sensory: No sensory deficit.     Motor: No weakness.     Coordination: Coordination normal.  Psychiatric:        Mood and Affect: Mood normal.     (all labs ordered are listed, but only abnormal results are displayed) Labs Reviewed  CBC WITH DIFFERENTIAL/PLATELET - Abnormal; Notable for the following components:      Result Value   Monocytes Absolute 1.3 (*)    Abs Immature Granulocytes 0.08 (*)    All other components within normal limits  COMPREHENSIVE METABOLIC PANEL WITH GFR - Abnormal; Notable for the following components:   Glucose, Bld 106 (*)    All other components within normal limits  CBG MONITORING, ED - Abnormal; Notable for the following components:   Glucose-Capillary 110 (*)    All other components within normal limits  C DIFFICILE QUICK SCREEN W PCR REFLEX    GASTROINTESTINAL PANEL BY PCR, STOOL (REPLACES STOOL CULTURE)  LIPASE, BLOOD  MAGNESIUM    EKG: None  Radiology: CT ABDOMEN PELVIS W CONTRAST Result Date: 02/28/2024 CLINICAL DATA:  Generalized abdominal pain EXAM: CT ABDOMEN AND PELVIS WITH CONTRAST TECHNIQUE: Multidetector CT imaging of the abdomen and pelvis was performed using the standard protocol following bolus administration of intravenous contrast. RADIATION DOSE REDUCTION: This exam was performed according to the departmental dose-optimization program which includes automated exposure control, adjustment of the mA and/or kV according to patient size and/or use of iterative reconstruction technique. CONTRAST:  75mL OMNIPAQUE  IOHEXOL  350 MG/ML SOLN COMPARISON:  12/20/2023 FINDINGS: Lower chest: No acute abnormality. Hepatobiliary: No focal liver abnormality is seen. Status post cholecystectomy. No biliary dilatation. Pancreas: Unremarkable. No pancreatic ductal dilatation or  surrounding inflammatory changes. Spleen: Normal in size without focal abnormality. Adrenals/Urinary Tract: Adrenal glands are within normal limits. Kidneys demonstrate a normal enhancement pattern bilaterally. No abnormal mass is noted. Tiny nonobstructing stone is again noted in the lower pole of the left kidney. The ureters are within normal limits. The bladder is partially distended. Stomach/Bowel: The appendix is not well visualized. Colon shows no obstructive changes. The distal colon from the splenic flexure to the rectum shows some mild wall thickening which may represent some mild colitis. No perforation or abscess is seen. The more proximal colon shows fluid within although no inflammatory changes are noted. The stomach and small bowel are within normal limits. Vascular/Lymphatic: No significant vascular findings are present. No enlarged abdominal or pelvic lymph nodes. Reproductive: Prostate is unremarkable. Other: No abdominal wall hernia or abnormality.  No abdominopelvic ascites. Musculoskeletal: No acute or significant osseous findings. IMPRESSION: Mild wall thickening in the colon extending from the splenic flexure to the rectum suspicious for focal colitis. No definitive vascular abnormality is noted. This may be infectious in etiology. Tiny nonobstructing left renal stone unchanged from the prior exam. Electronically Signed   By: Oneil Devonshire M.D.   On: 02/28/2024 20:50     Procedures   Medications Ordered in the ED  metroNIDAZOLE  (FLAGYL ) tablet 500 mg (has no administration in time range)  ciprofloxacin  (CIPRO ) tablet 500 mg (has no administration in time range)  sodium chloride  0.9 % bolus 1,000 mL (0 mLs Intravenous Stopped 02/28/24 1840)  iohexol  (OMNIPAQUE ) 350 MG/ML injection 75 mL (75 mLs Intravenous Contrast Given 02/28/24 1906)                                    Medical Decision Making Amount and/or Complexity of Data Reviewed Labs: ordered. Radiology:  ordered.  Risk Prescription drug management.   Brian Pittman is here with diarrhea and abdominal pain.  History of diabetes..  Overall we will get basic labs CBC CMP lipase will study C. difficile testing.  Differential diagnosis includes C. difficile, colitis, viral process foodborne illness.  Will get a CT scan of the abdomen pelvis.  He was recently on antibiotics for epididymitis.  He denies any fevers or chills.  Denies any nausea or vomiting.  Diarrhea now for the last several days.  Overall lab work shows no significant leukocytosis anemia or electrolyte abnormality.  C. difficile test negative.  CT scan concerning for colitis.  Overall I do suspect that this is an infectious colitis.  Will treat with Cipro  and Flagyl .  GI stool pathogen panel has been sent.  Recommend follow-up with primary care doctor.  Discharged in good condition.  This chart was dictated using voice recognition software.  Despite best efforts to proofread,  errors can occur which can change the documentation meaning.      Final diagnoses:  Diarrhea, unspecified type  Colitis    ED Discharge Orders          Ordered    ciprofloxacin  (CIPRO ) 500 MG tablet  Every 12 hours        02/28/24 2229    metroNIDAZOLE  (FLAGYL ) 500 MG tablet  2 times daily        02/28/24 2229               Ruthe Cornet, DO 02/28/24 2230

## 2024-03-01 LAB — GASTROINTESTINAL PANEL BY PCR, STOOL (REPLACES STOOL CULTURE)

## 2024-03-14 LAB — MISCELLANEOUS TEST

## 2024-05-01 ENCOUNTER — Ambulatory Visit (HOSPITAL_BASED_OUTPATIENT_CLINIC_OR_DEPARTMENT_OTHER)
Admission: RE | Admit: 2024-05-01 | Discharge: 2024-05-01 | Disposition: A | Discharge status: Home / Self Care | Source: Ambulatory Visit | Attending: Medical | Admitting: Medical

## 2024-05-01 ENCOUNTER — Encounter: Payer: Self-pay | Admitting: Medical

## 2024-05-01 ENCOUNTER — Ambulatory Visit: Admitting: Medical

## 2024-05-01 ENCOUNTER — Ambulatory Visit: Payer: Self-pay | Admitting: Medical

## 2024-05-01 VITALS — BP 130/80 | HR 78 | Temp 97.7°F | Resp 15 | Ht 70.0 in | Wt 184.2 lb

## 2024-05-01 DIAGNOSIS — R519 Headache, unspecified: Secondary | ICD-10-CM | POA: Diagnosis not present

## 2024-05-01 DIAGNOSIS — R591 Generalized enlarged lymph nodes: Secondary | ICD-10-CM | POA: Diagnosis not present

## 2024-05-01 DIAGNOSIS — E1165 Type 2 diabetes mellitus with hyperglycemia: Secondary | ICD-10-CM | POA: Diagnosis not present

## 2024-05-01 DIAGNOSIS — S46811A Strain of other muscles, fascia and tendons at shoulder and upper arm level, right arm, initial encounter: Secondary | ICD-10-CM | POA: Diagnosis not present

## 2024-05-01 DIAGNOSIS — R030 Elevated blood-pressure reading, without diagnosis of hypertension: Secondary | ICD-10-CM

## 2024-05-01 DIAGNOSIS — R6884 Jaw pain: Secondary | ICD-10-CM

## 2024-05-01 LAB — CBC WITH DIFFERENTIAL/PLATELET
Basophils Absolute: 0 K/uL (ref 0.0–0.1)
Basophils Relative: 0.6 % (ref 0.0–3.0)
Eosinophils Absolute: 0.1 K/uL (ref 0.0–0.7)
Eosinophils Relative: 1.2 % (ref 0.0–5.0)
HCT: 47.3 % (ref 39.0–52.0)
Hemoglobin: 15.6 g/dL (ref 13.0–17.0)
Lymphocytes Relative: 19.1 % (ref 12.0–46.0)
Lymphs Abs: 1.5 K/uL (ref 0.7–4.0)
MCHC: 33.1 g/dL (ref 30.0–36.0)
MCV: 89.2 fl (ref 78.0–100.0)
Monocytes Absolute: 0.6 K/uL (ref 0.1–1.0)
Monocytes Relative: 7.3 % (ref 3.0–12.0)
Neutro Abs: 5.6 K/uL (ref 1.4–7.7)
Neutrophils Relative %: 71.8 % (ref 43.0–77.0)
Platelets: 368 K/uL (ref 150.0–400.0)
RBC: 5.3 Mil/uL (ref 4.22–5.81)
RDW: 13.2 % (ref 11.5–15.5)
WBC: 7.8 K/uL (ref 4.0–10.5)

## 2024-05-01 LAB — SEDIMENTATION RATE: Sed Rate: 2 mm/h (ref 0–20)

## 2024-05-01 NOTE — Progress Notes (Signed)
 Subjective:    Patient ID: Brian Pittman, male    DOB: March 20, 1966, 58 y.o.   MRN: [id number]  HPI  Brian Pittman is a 58 year old male who presents with persistent facial pain and swelling.  He has been experiencing severe pain and swelling in the right lower jaw area following dental procedures on April 12, 2024, where  crowns were placed on two teeth. Despite the dentist not identifying any infection initially, significant pain developed post-procedure. He was prescribed prednisone and clindamycin, but the pain persisted.  Motrin provided temporary relief, but the pain returned as the medication wore off. An endodontist evaluated him and did not recommend a root canal, but an oral surgeon later extracted the wisdom tooth and another molar due to cracks. Despite these extractions, he continues to experience swelling and pain.  The pain is severe, radiating from the jaw to the ear and neck, and is associated with headaches and earaches. He has been taking high doses of Motrin and Tylenol , estimating over 200 doses of each in the past three weeks, and recently completed a course of doxycycline . The pain level is currently 6 to 7 out of 10, having been as high as 8 to 9 previously.  He has a history of diabetes and recent episodes of colitis and cystitis over the summer, for which he was treated with antibiotics including Cipro  and levofloxacin . He is currently on Farxiga , Actos , and glipizide  for diabetes management. He reports a recent weight loss, which he attributes to decreased appetite due to pain.  No vision changes, nausea, or vomiting. Reports rt side jaw and ear pain. He reports that he is not on any antihypertensive medication and that his blood pressure is usually good.    Review of Systems  HENT:  Negative for congestion and ear pain.        See hpi  Respiratory:  Negative for cough, chest tightness, shortness of breath and wheezing.   Cardiovascular:  Negative for chest  pain and palpitations.  Gastrointestinal:  Negative for abdominal pain, nausea and vomiting.  Musculoskeletal:  Negative for back pain and neck pain.  Neurological:  Negative for dizziness, syncope, weakness, numbness and headaches.       Occipaital area pain where trap inserts and rt temporal area pain.  Hematological:  Negative for adenopathy. Does not bruise/bleed easily.  Psychiatric/Behavioral:  Negative for confusion. The patient is not nervous/anxious.     Past Medical History:  Diagnosis Date   Body mass index (BMI) 31.0-31.9, adult    DM2 (diabetes mellitus, type 2) (HCC)    Ear infection    Generalized anxiety disorder 06/28/2018   GERD (gastroesophageal reflux disease)    Hypercholesterolemia    Panic disorder (episodic paroxysmal anxiety) 09/14/2016   Pneumonia    Sleep apnea    Trochanteric bursitis of right hip 05/17/2018     Social History   Socioeconomic History   Marital status: Single    Spouse name: Not on file   Number of children: Not on file   Years of education: Not on file   Highest education level: Not on file  Occupational History   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, 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  Tobacco Use   Smoking status: Never   Smokeless tobacco: Former    Types: Snuff    Quit date: 10/2018   Tobacco comments:    quit over 20 years ago  Vaping Use   Vaping status: Never Used  Substance  and Sexual Activity   Alcohol use: Yes    Comment: maybe monthly   Drug use: No   Sexual activity: Not on file  Other Topics Concern   Not on file  Social History Narrative   Not on file   Social Drivers of Health   Financial Resource Strain: Not on file  Food Insecurity: Not on file  Transportation Needs: Not on file  Physical Activity: Not on file  Stress: Not on file  Social Connections: Not on file  Intimate Partner Violence: Not on file    Past Surgical History:  Procedure Laterality Date   CHOLECYSTECTOMY     COLONOSCOPY     Dr  Anette around 2016 said it was normal    Family History  Problem Relation Age of Onset   Diabetes Mother    Diabetes Father    Leukemia Father    Skin cancer Father    Heart attack Father    Atrial fibrillation Father    Lung cancer Maternal Grandfather    Heart attack Paternal Grandmother    Leukemia Paternal Grandmother    Stroke Paternal Grandfather    Leukemia Paternal Grandfather    GER disease Sister    Arthritis Sister    Colon cancer Neg Hx    Esophageal cancer Neg Hx    Stomach cancer Neg Hx     Allergies  Allergen Reactions   Jardiance [Empagliflozin]    Metformin Diarrhea   Ozempic (0.25 Or 0.5 Mg-Dose) [Semaglutide(0.25 Or 0.5mg -Dos)] Nausea Only   Penicillins Hives and Itching   Prednisone     Elevates blood surgar, increased heart rate   Ertugliflozin Rash   Lovastatin Rash    Current Outpatient Medications on File Prior to Visit  Medication Sig Dispense Refill   bisacodyl  (DULCOLAX) 5 MG EC tablet Take 1 tablet (5 mg total) by mouth 2 (two) times daily. 14 tablet 0   ciprofloxacin  (CIPRO ) 500 MG tablet Take 1 tablet (500 mg total) by mouth every 12 (twelve) hours. 10 tablet 0   dapagliflozin  propanediol (FARXIGA ) 5 MG TABS tablet Take 1 tablet (5 mg total) by mouth daily. 90 tablet 3   diclofenac  (VOLTAREN ) 75 MG EC tablet Take 1 tablet (75 mg total) by mouth 2 (two) times daily. 60 tablet 1   gabapentin  (NEURONTIN ) 300 MG capsule Take 1 capsule (300 mg total) by mouth at bedtime. 90 capsule 3   glipiZIDE  (GLUCOTROL ) 5 MG tablet TAKE TWO TABLETS BY MOUTH BEFORE BREAKFAST AND TAKE TWO TABLETS BEFORE SUPPER 360 tablet 1   HYDROcodone -acetaminophen  (NORCO/VICODIN) 5-325 MG tablet Take 1 tablet by mouth every 6 (six) hours as needed. 10 tablet 0   levocetirizine (XYZAL ) 5 MG tablet Take 1 tablet (5 mg total) by mouth every evening. 90 tablet 1   meloxicam  (MOBIC ) 7.5 MG tablet Take 1 tablet (7.5 mg total) by mouth 2 (two) times daily as needed. 45 tablet 1    metroNIDAZOLE  (FLAGYL ) 500 MG tablet Take 1 tablet (500 mg total) by mouth 2 (two) times daily. 14 tablet 0   omeprazole  (PRILOSEC) 40 MG capsule TAKE ONE CAPSULE BY MOUTH DAILY 90 capsule 0   pioglitazone  (ACTOS ) 15 MG tablet Take 1 tablet (15 mg total) by mouth daily. 90 tablet 3   rosuvastatin  (CRESTOR ) 20 MG tablet Take 1 tablet (20 mg total) by mouth daily. 90 tablet 3   sodium polystyrene (KAYEXALATE ) 15 GM/60ML suspension 60 ml twice daily for 3 days 360 mL 0   No current  facility-administered medications on file prior to visit.    BP 130/80   Pulse 78   Temp 97.7 F (36.5 C) (Oral)   Resp 15   Ht 5' 10 (1.778 m)   Wt 184 lb 3.2 oz (83.6 kg)   SpO2 97%   BMI 26.43 kg/m         Objective:   Physical Exam  General Mental Status- Alert. General Appearance- Not in acute distress.   Skin -normal on side of face. No redness. No warmth.  Neck No JVD.rt submandibular area tender to palpation and area anterior to that region. No neck stiffness  Chest and Lung Exam Auscultation: Breath Sounds:-CTA  Cardiovascular Auscultation:Rythm- RRR Murmurs & Other Heart Sounds:Auscultation of the heart reveals- No Murmurs.  Abdomen Inspection:-Inspeection Normal. Palpation/Percussion:Note:No mass. Palpation and Percussion of the abdomen reveal- Non Tender, Non Distended + BS, no rebound or guarding.    Neurologic Cranial Nerve exam:- CN III-XII intact(No nystagmus), symmetric smile. Strength:- 5/5 equal and symmetric strength both upper and lower extremities.     Heent- no sinus pressure. Ears- canals clear and normal tms.    Assessment & Plan:   Patient Instructions  Right lower jaw pain and swelling after dental extraction Persistent pain and swelling post-extraction, possible residual infection or lymphadenopathy. - Order CBC and sed rate. - Order ultrasound of soft tissue neck. - Advise seeing oral surgeon today. - Hold ibuprofen due to elevated blood  pressure. - Use Tylenol  for moderate pain, Norco for severe pain. - Monitor blood pressure if using NSAIDs.  Trapezius strain/muscle tension contricuting to occiptal pain where muscle inserts Headache and neck pain likely due to muscle tension from stress and ongoing pain. - normal neurologic exam. -sed rate since some temporal area pain.  Elevated bp, possibly pain-induced. On recheck bp did come down Elevated blood pressure likely due to pain and stress. - Monitor blood pressure regularly. - Advise against ibuprofen if blood pressure elevated.  Type 2 diabetes mellitus Type 2 diabetes with recent A1c of 8.1%. - Order metabolic panel. - Continue current diabetes medications.  Follow up date to be determined after lab and imaging review    I personally spent a total of42 minutes in the care of the patient today including performing a medically appropriate exam/evaluation, counseling and educating, placing orders, and documenting clinical information in the EHR.

## 2024-05-01 NOTE — Patient Instructions (Addendum)
 Right lower jaw pain and swelling after dental extraction Persistent pain and swelling post-extraction, possible residual infection or lymphadenopathy. - Order CBC and sed rate. - Order ultrasound of soft tissue neck. - Advise seeing oral surgeon today. Let me know what they say. - Hold ibuprofen due to elevated blood pressure. - Use Tylenol  for moderate pain, Norco for severe pain. - Monitor blood pressure if using NSAIDs.  Trapezius strain/muscle tension contricuting to occiptal pain where muscle inserts Headache and neck pain likely due to muscle tension from stress and ongoing pain. - normal neurologic exam. -sed rate since some temporal area pain.  Elevated bp, possibly pain-induced. On recheck bp did come down Elevated blood pressure likely due to pain and stress. - Monitor blood pressure regularly. - Advise against ibuprofen if blood pressure elevated.  Type 2 diabetes mellitus Type 2 diabetes with recent A1c of 8.1%. - Order metabolic panel. - Continue current diabetes medications.  Follow up date to be determined after lab and imaging review

## 2024-05-02 LAB — COMPLETE METABOLIC PANEL WITHOUT GFR
AG Ratio: 2.1 (calc) (ref 1.0–2.5)
ALT: 27 U/L (ref 9–46)
AST: 16 U/L (ref 10–35)
Albumin: 5 g/dL (ref 3.6–5.1)
Alkaline phosphatase (APISO): 71 U/L (ref 35–144)
BUN/Creatinine Ratio: 20 (calc) (ref 6–22)
BUN: 12 mg/dL (ref 7–25)
CO2: 31 mmol/L (ref 20–32)
Calcium: 10.6 mg/dL — ABNORMAL HIGH (ref 8.6–10.3)
Chloride: 99 mmol/L (ref 98–110)
Creat: 0.6 mg/dL — ABNORMAL LOW (ref 0.70–1.30)
Globulin: 2.4 g/dL (ref 1.9–3.7)
Glucose, Bld: 170 mg/dL — ABNORMAL HIGH (ref 65–99)
Potassium: 4.7 mmol/L (ref 3.5–5.3)
Sodium: 138 mmol/L (ref 135–146)
Total Bilirubin: 0.7 mg/dL (ref 0.2–1.2)
Total Protein: 7.4 g/dL (ref 6.1–8.1)

## 2024-05-02 NOTE — Progress Notes (Signed)
 Last read by Brian Pittman at 2:50PM on 05/02/2024

## 2024-05-03 ENCOUNTER — Telehealth: Payer: Self-pay

## 2024-05-03 NOTE — Telephone Encounter (Signed)
 Pt.notified

## 2024-05-03 NOTE — Telephone Encounter (Signed)
 Copied from CRM #[id number]. Topic: General - Other >> May 03, 2024  9:14 AM Brian Pittman wrote: Reason for CRM: patient wants to know what test he will be getting the day of his appointment. If he needs to fast or not. Please follow up with patient.

## 2024-05-10 ENCOUNTER — Ambulatory Visit: Admitting: Medical

## 2024-05-17 ENCOUNTER — Ambulatory Visit (INDEPENDENT_AMBULATORY_CARE_PROVIDER_SITE_OTHER): Admitting: Medical

## 2024-05-17 DIAGNOSIS — R5383 Other fatigue: Secondary | ICD-10-CM | POA: Diagnosis not present

## 2024-05-17 DIAGNOSIS — M26629 Arthralgia of temporomandibular joint, unspecified side: Secondary | ICD-10-CM | POA: Diagnosis not present

## 2024-05-17 DIAGNOSIS — G4730 Sleep apnea, unspecified: Secondary | ICD-10-CM

## 2024-05-17 DIAGNOSIS — R7989 Other specified abnormal findings of blood chemistry: Secondary | ICD-10-CM

## 2024-05-17 DIAGNOSIS — M79609 Pain in unspecified limb: Secondary | ICD-10-CM

## 2024-05-17 LAB — CBC WITH DIFFERENTIAL/PLATELET
Basophils Absolute: 0.1 K/uL (ref 0.0–0.1)
Basophils Relative: 0.8 % (ref 0.0–3.0)
Eosinophils Absolute: 0.1 K/uL (ref 0.0–0.7)
Eosinophils Relative: 1.8 % (ref 0.0–5.0)
HCT: 46.4 % (ref 39.0–52.0)
Hemoglobin: 15.6 g/dL (ref 13.0–17.0)
Lymphocytes Relative: 25.3 % (ref 12.0–46.0)
Lymphs Abs: 1.7 K/uL (ref 0.7–4.0)
MCHC: 33.7 g/dL (ref 30.0–36.0)
MCV: 89.3 fl (ref 78.0–100.0)
Monocytes Absolute: 0.5 K/uL (ref 0.1–1.0)
Monocytes Relative: 7.9 % (ref 3.0–12.0)
Neutro Abs: 4.3 K/uL (ref 1.4–7.7)
Neutrophils Relative %: 64.2 % (ref 43.0–77.0)
Platelets: 276 K/uL (ref 150.0–400.0)
RBC: 5.19 Mil/uL (ref 4.22–5.81)
RDW: 13.5 % (ref 11.5–15.5)
WBC: 6.6 K/uL (ref 4.0–10.5)

## 2024-05-17 LAB — T4, FREE: Free T4: 0.65 ng/dL (ref 0.60–1.60)

## 2024-05-17 LAB — TSH: TSH: 2.87 u[IU]/mL (ref 0.35–5.50)

## 2024-05-17 LAB — VITAMIN B12: Vitamin B-12: 357 pg/mL (ref 211–911)

## 2024-05-17 MED ORDER — CYCLOBENZAPRINE HCL 5 MG PO TABS: ORAL_TABLET | ORAL | 0 refills | Status: AC

## 2024-05-17 NOTE — Progress Notes (Signed)
 Subjective:    Patient ID: Brian Pittman, male    DOB: 23-Mar-1966, 58 y.o.   MRN: [id number]  HPI  Brian Pittman is a 58 year old male who presents with persistent fatigue and jaw discomfort.  He has been experiencing persistent fatigue and jaw discomfort. There was a history of a knot under the right mandible, which has mostly resolved. Previously, he experienced significant pain in the mandible area and was treated with three different antibiotics. An ultrasound of the lymph node was performed as part of the prior workup. He had dental issues, including an infection that led to pressure buildup, which was addressed by crowning three teeth but in end had to remove all the crown/teeth(extractions). The knot has reduced significantly, and associated headaches and earaches have improved/basiclly resolved.  He reports ongoing fatigue, lacking energy and feeling exhausted after minimal exertion. He was previously active but now feels fatigued after walking short distances. He attributes some of this to stress, medication, and changes in eating habits due to dental issues. He has lost weight over the past few weeks, partly due to difficulty eating solid foods during the dental infection period.  He has a history of sleep apnea, previously described as borderline. He used to snore loudly but has not experienced this recently, possibly due to weight loss. He has lost weight over the past few years due to dietary changes related to diabetes management and a previous episode of colitis.  He mentions a high calcium  level of 10.6 and is concerned about its implications. He is currently taking Farxiga  for diabetes management and has an A1c of 8.1. He is scheduled to see his endocrinologist soon.  He reports tightness in the right TMJ area since the dental procedures, with no clicking but some swelling when chewing. He has been consuming mostly soft foods to avoid exacerbating the  discomfort.       Review of Systems  Constitutional:  Positive for fatigue. Negative for chills and fever.  HENT:  Negative for congestion and ear pain.        Rt tmj area tightness.  Respiratory:  Negative for cough, chest tightness and wheezing.   Cardiovascular:  Negative for chest pain and palpitations.  Gastrointestinal:  Negative for abdominal pain, diarrhea and vomiting.  Genitourinary:  Negative for dysuria and hematuria.  Musculoskeletal:  Negative for back pain and neck pain.  Skin:  Negative for rash.  Neurological:  Negative for dizziness, syncope and light-headedness.  Hematological:  Negative for adenopathy.  Psychiatric/Behavioral:  Negative for behavioral problems and dysphoric mood.     Past Medical History:  Diagnosis Date   Body mass index (BMI) 31.0-31.9, adult    DM2 (diabetes mellitus, type 2) (HCC)    Ear infection    Generalized anxiety disorder 06/28/2018   GERD (gastroesophageal reflux disease)    Hypercholesterolemia    Panic disorder (episodic paroxysmal anxiety) 09/14/2016   Pneumonia    Sleep apnea    Trochanteric bursitis of right hip 05/17/2018     Social History   Socioeconomic History   Marital status: Single    Spouse name: Not on file   Number of children: Not on file   Years of education: Not on file   Highest education level: Not on file  Occupational History   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, [employer] EMPLOYED  Tobacco Use   Smoking status: Never   Smokeless tobacco: Former    Types: Snuff  Quit date: 10/2018   Tobacco comments:    quit over 20 years ago  Vaping Use   Vaping status: Never Used  Substance and Sexual Activity   Alcohol use: Yes    Comment: maybe monthly   Drug use: No   Sexual activity: Not on file  Other Topics Concern   Not on file  Social History Narrative   Not on file   Social Drivers of Health   Financial Resource Strain: Not on file  Food Insecurity: Not on file   Transportation Needs: Not on file  Physical Activity: Not on file  Stress: Not on file  Social Connections: Not on file  Intimate Partner Violence: Not on file    Past Surgical History:  Procedure Laterality Date   CHOLECYSTECTOMY     COLONOSCOPY     Dr Anette around 2016 said it was normal    Family History  Problem Relation Age of Onset   Diabetes Mother    Diabetes Father    Leukemia Father    Skin cancer Father    Heart attack Father    Atrial fibrillation Father    Lung cancer Maternal Grandfather    Heart attack Paternal Grandmother    Leukemia Paternal Grandmother    Stroke Paternal Grandfather    Leukemia Paternal Grandfather    GER disease Sister    Arthritis Sister    Colon cancer Neg Hx    Esophageal cancer Neg Hx    Stomach cancer Neg Hx     Allergies  Allergen Reactions   Jardiance [Empagliflozin]    Metformin Diarrhea   Ozempic (0.25 Or 0.5 Mg-Dose) [Semaglutide(0.25 Or 0.5mg -Dos)] Nausea Only   Penicillins Hives and Itching   Prednisone     Elevates blood surgar, increased heart rate   Ertugliflozin Rash   Lovastatin Rash    Current Outpatient Medications on File Prior to Visit  Medication Sig Dispense Refill   bisacodyl  (DULCOLAX) 5 MG EC tablet Take 1 tablet (5 mg total) by mouth 2 (two) times daily. 14 tablet 0   ciprofloxacin  (CIPRO ) 500 MG tablet Take 1 tablet (500 mg total) by mouth every 12 (twelve) hours. 10 tablet 0   dapagliflozin  propanediol (FARXIGA ) 5 MG TABS tablet Take 1 tablet (5 mg total) by mouth daily. 90 tablet 3   diclofenac  (VOLTAREN ) 75 MG EC tablet Take 1 tablet (75 mg total) by mouth 2 (two) times daily. 60 tablet 1   gabapentin  (NEURONTIN ) 300 MG capsule Take 1 capsule (300 mg total) by mouth at bedtime. 90 capsule 3   glipiZIDE  (GLUCOTROL ) 5 MG tablet TAKE TWO TABLETS BY MOUTH BEFORE BREAKFAST AND TAKE TWO TABLETS BEFORE SUPPER 360 tablet 1   HYDROcodone -acetaminophen  (NORCO/VICODIN) 5-325 MG tablet Take 1 tablet by  mouth every 6 (six) hours as needed. 10 tablet 0   levocetirizine (XYZAL ) 5 MG tablet Take 1 tablet (5 mg total) by mouth every evening. 90 tablet 1   meloxicam  (MOBIC ) 7.5 MG tablet Take 1 tablet (7.5 mg total) by mouth 2 (two) times daily as needed. 45 tablet 1   metroNIDAZOLE  (FLAGYL ) 500 MG tablet Take 1 tablet (500 mg total) by mouth 2 (two) times daily. 14 tablet 0   omeprazole  (PRILOSEC) 40 MG capsule TAKE ONE CAPSULE BY MOUTH DAILY 90 capsule 0   pioglitazone  (ACTOS ) 15 MG tablet Take 1 tablet (15 mg total) by mouth daily. 90 tablet 3   rosuvastatin  (CRESTOR ) 20 MG tablet Take 1 tablet (20 mg total) by mouth  daily. 90 tablet 3   sodium polystyrene (KAYEXALATE ) 15 GM/60ML suspension 60 ml twice daily for 3 days 360 mL 0   No current facility-administered medications on file prior to visit.    BP 122/78   Pulse 70   Temp 97.9 F (36.6 C) (Oral)   Resp 15   Ht 5' 10 (1.778 m)   Wt 189 lb 12.8 oz (86.1 kg)   SpO2 99%   BMI 27.23 kg/m        Objective:   Physical Exam  General Mental Status- Alert. General Appearance- Not in acute distress.   Skin General: Color- Normal Color. Moisture- Normal Moisture.  Neck  No JVD.  Chest and Lung Exam Auscultation: Breath Sounds:-CTA  Cardiovascular Auscultation:Rythm- RRR Murmurs & Other Heart Sounds:Auscultation of the heart reveals- No Murmurs.  Abdomen Inspection:-Inspeection Normal. Palpation/Percussion:Note:No mass. Palpation and Percussion of the abdomen reveal- Non Tender, Non Distended + BS, no rebound or guarding.   Neurologic Cranial Nerve exam:- CN III-XII intact(No nystagmus), symmetric smile. Strength:- 5/5 equal and symmetric strength both upper and lower extremities.   Lower ext- symmetric, no edema. But on palpation rt popilteal area no pain presently but does report intermittent pain worse at night.     Assessment & Plan:   Fatigue Persistent fatigue post-dental treatment. Differential includes  thyroid dysfunction, vitamin deficiencies, and sleep apnea. Weight loss may affect/decrease sleep apnea risk. - Order TSH, T4, B12, B1, cbc and iron levels. - Discuss sleep apnea symptoms with family or friends.  Right temporomandibular joint pain and tightness Post-dental procedure TMJ tightness and swelling with chewing. Likely related to recent dental procedures. - Prescribe 5 mg muscle relaxant at night as needed for neck or TMJ area pain or tightness. - Advise soft food diet and avoid crunchy foods.  Hypercalcemia Calcium  level 10.6. Evaluate for parathyroid involvement. - Order PTH and repeat calcium  level.  Type 2 diabetes mellitus Recent A1c 8.1. Managed with endocrinologist. Weight loss possibly related to dietary changes and diabetes management. - Continue follow-up with endocrinologist.  Rt popliteal pain at times more at night --with fatigue on minimal exertion decided best to get Rt lower ext US  stat.   Follow up date to be determined after lab review   Talked with radiology supervisor. I had order left lower ext US  but pt complained of rt side symptoms. Supervisor clarified with pt and he stated and point to his rt side. So she is changing order.   Brian Fahrner, PA-C

## 2024-05-17 NOTE — Patient Instructions (Addendum)
 Fatigue Persistent fatigue post-dental treatment. Differential includes thyroid dysfunction, vitamin deficiencies, and sleep apnea. Weight loss may affect/decrease sleep apnea risk. - Order TSH, T4, B12, B1, cbc and iron levels. - Discuss sleep apnea symptoms with family or friends.  Right temporomandibular joint pain and tightness Post-dental procedure TMJ tightness and swelling with chewing. Likely related to recent dental procedures. - Prescribe 5 mg muscle relaxant at night as needed for neck or TMJ area pain or tightness. - Advise soft food diet and avoid crunchy foods.  Hypercalcemia Calcium  level 10.6. Evaluate for parathyroid involvement. - Order PTH and repeat calcium  level.  Type 2 diabetes mellitus Recent A1c 8.1. Managed with endocrinologist. Weight loss possibly related to dietary changes and diabetes management. - Continue follow-up with endocrinologist.  Rt popliteal pain at times more at night. -with fatigue on minimal exertion decided best to get Rt lower ext US  stat.  Follow up date to be determined after lab review

## 2024-05-18 ENCOUNTER — Ambulatory Visit: Payer: Self-pay | Admitting: Medical

## 2024-05-18 LAB — IRON,TIBC AND FERRITIN PANEL
%SAT: 29 % (ref 20–48)
Ferritin: 392 ng/mL — ABNORMAL HIGH (ref 38–380)
Iron: 93 ug/dL (ref 50–180)
TIBC: 326 ug/dL (ref 250–425)

## 2024-05-18 LAB — PTH, INTACT AND CALCIUM
Calcium: 10.3 mg/dL (ref 8.6–10.3)
PTH: 37 pg/mL (ref 16–77)

## 2024-05-19 ENCOUNTER — Ambulatory Visit: Admitting: Internal Medicine

## 2024-05-19 ENCOUNTER — Ambulatory Visit (HOSPITAL_BASED_OUTPATIENT_CLINIC_OR_DEPARTMENT_OTHER)
Admission: RE | Admit: 2024-05-19 | Discharge: 2024-05-19 | Disposition: A | Discharge status: Home / Self Care | Source: Ambulatory Visit | Attending: Medical | Admitting: Medical

## 2024-05-19 ENCOUNTER — Ambulatory Visit (HOSPITAL_BASED_OUTPATIENT_CLINIC_OR_DEPARTMENT_OTHER)

## 2024-05-19 VITALS — BP 130/80 | HR 79 | Ht 70.0 in | Wt 187.0 lb

## 2024-05-19 DIAGNOSIS — E785 Hyperlipidemia, unspecified: Secondary | ICD-10-CM | POA: Diagnosis not present

## 2024-05-19 DIAGNOSIS — Z7984 Long term (current) use of oral hypoglycemic drugs: Secondary | ICD-10-CM

## 2024-05-19 DIAGNOSIS — E1165 Type 2 diabetes mellitus with hyperglycemia: Secondary | ICD-10-CM | POA: Diagnosis not present

## 2024-05-19 DIAGNOSIS — M79609 Pain in unspecified limb: Secondary | ICD-10-CM | POA: Diagnosis not present

## 2024-05-19 DIAGNOSIS — M79604 Pain in right leg: Secondary | ICD-10-CM | POA: Diagnosis not present

## 2024-05-19 DIAGNOSIS — E119 Type 2 diabetes mellitus without complications: Secondary | ICD-10-CM

## 2024-05-19 LAB — POCT GLYCOSYLATED HEMOGLOBIN (HGB A1C): Hemoglobin A1C: 8.3 % — AB (ref 4.0–5.6)

## 2024-05-19 MED ORDER — GLIPIZIDE 10 MG PO TABS: 20.0000 mg | ORAL_TABLET | Freq: Two times a day (BID) | ORAL | 3 refills | Status: AC

## 2024-05-19 NOTE — Addendum Note (Signed)
 Addended by: DORINA DALLAS HERO on: 05/19/2024 08:41 AM   Modules accepted: Orders

## 2024-05-19 NOTE — Progress Notes (Signed)
 Called imaging with no answer will try again soon

## 2024-05-19 NOTE — Progress Notes (Signed)
 Name: Brian Pittman  Age/ Sex: 58 y.o., male   MRN/ DOB: [id number], September 28, 1965     PCP: Dorina Dallas RIGGERS   Reason for Endocrinology Evaluation: Type 2 Diabetes Mellitus  Initial Endocrine Consultative Visit: 02/14/2020    PATIENT IDENTIFIER: [name] is a 58 y.o. male with a past medical history of T2DM and Dyslipidemia. The patient has followed with Endocrinology clinic since 02/14/2020 for consultative assistance with management of his diabetes.      DIABETIC HISTORY:  Brian Pittman was diagnosed with DM in 2015,metformin - GI side effects . Ozempic/Trulicity - sour stomach and gas . His hemoglobin A1c has ranged from 7.1% in 08/2019, peaking at 7.5% in 03/2020.  On his initial to our clinic he had an A1c of 7.1 %. He was on Amaryl and Qtern . We switched Glimepiride to Glipizide  and continued Qtern  but by 07/2020 Qtern  became cost prohibitve and started Farxiga   Started pioglitazone  June, 2025 with an A1c of 8.1% Decreased Farxiga  by 50% due to hyperkalemia in June, 2025    LOW TESTOSTERONE  HISTORY: During evaluation of fatigue, Pt was noted with low testosterone  at PCP's office 167 ng/dL in 07/7973. He was also noted with low SHBG .   Patient's LH, prolactin, and free testosterone  were normal 06/2023   DYSLIPIDEMIA :  Increase rosuvastatin  from 10 mg to 20 mg in June, 2025 Due to elevated LDL and triglycerides   Started gabapentin  June, 2025- but he self discontinued as it didn;t make him feel good     SUBJECTIVE:   During the last visit (01/17/2024): A1c 8.1%   Today (05/19/2024): [name] is here for a follow up on diabetes. [name]  He checks his blood sugars 2 times daily. The patient has not had hypoglycemic episodes since the last clinic visit.    Since his visit here was dx with colitis Had issues with right teeth pain requiring multiple courses of Abx, Prednisone   No nausea or vomiting  Diarrhea has resolved   Has $5000 deductible   HOME DIABETES  REGIMEN:  Glipizide  5 mg,  2 tabs before breakfast and 2 tab before supper Pioglitazone  15 mg daily Farxiga  5 mg daily  Rosuvastatin  20 mg daily   Statin: Yes ACE-I/ARB: no   GLUCOSE LOG:      DIABETIC COMPLICATIONS: Microvascular complications:   Denies: CKD, neuropathy, retinopathy  Last Eye Exam: Completed 2025 Dr. Camillo   Macrovascular complications:   Denies: CAD, CVA, PVD   HISTORY:  Past Medical History:  Past Medical History:  Diagnosis Date   Body mass index (BMI) 31.0-31.9, adult    DM2 (diabetes mellitus, type 2) (HCC)    Ear infection    Generalized anxiety disorder 06/28/2018   GERD (gastroesophageal reflux disease)    Hypercholesterolemia    Panic disorder (episodic paroxysmal anxiety) 09/14/2016   Pneumonia    Sleep apnea    Trochanteric bursitis of right hip 05/17/2018   Past Surgical History:  Past Surgical History:  Procedure Laterality Date   CHOLECYSTECTOMY     COLONOSCOPY     Dr Anette around 2016 said it was normal   Social History:  reports that he has never smoked. He quit smokeless tobacco use about 5 years ago.  His smokeless tobacco use included snuff. He reports current alcohol use. He reports that he does not use drugs. Family History:  Family History  Problem Relation Age of Onset   Diabetes Mother    Diabetes Father    Leukemia Father  Skin cancer Father    Heart attack Father    Atrial fibrillation Father    Lung cancer Maternal Grandfather    Heart attack Paternal Grandmother    Leukemia Paternal Grandmother    Stroke Paternal Grandfather    Leukemia Paternal Grandfather    GER disease Sister    Arthritis Sister    Colon cancer Neg Hx    Esophageal cancer Neg Hx    Stomach cancer Neg Hx      HOME MEDICATIONS: Allergies as of 05/19/2024       Reactions   Jardiance [empagliflozin]    Metformin Diarrhea   Ozempic (0.25 Or 0.5 Mg-dose) [semaglutide(0.25 Or 0.5mg -dos)] Nausea Only   Penicillins Hives, Itching    Prednisone    Elevates blood surgar, increased heart rate   Ertugliflozin Rash   Lovastatin Rash        Medication List        Accurate as of May 19, 2024  7:38 AM. If you have any questions, ask your nurse or doctor.          bisacodyl  5 MG EC tablet Commonly known as: DULCOLAX Take 1 tablet (5 mg total) by mouth 2 (two) times daily.   ciprofloxacin  500 MG tablet Commonly known as: CIPRO  Take 1 tablet (500 mg total) by mouth every 12 (twelve) hours.   cyclobenzaprine  5 MG tablet Commonly known as: FLEXERIL  1 tab po q hs prn neck or tmj area pain/tightness   dapagliflozin  propanediol 5 MG Tabs tablet Commonly known as: Farxiga  Take 1 tablet (5 mg total) by mouth daily.   diclofenac  75 MG EC tablet Commonly known as: VOLTAREN  Take 1 tablet (75 mg total) by mouth 2 (two) times daily.   gabapentin  300 MG capsule Commonly known as: NEURONTIN  Take 1 capsule (300 mg total) by mouth at bedtime.   glipiZIDE  5 MG tablet Commonly known as: GLUCOTROL  TAKE TWO TABLETS BY MOUTH BEFORE BREAKFAST AND TAKE TWO TABLETS BEFORE SUPPER   HYDROcodone -acetaminophen  5-325 MG tablet Commonly known as: NORCO/VICODIN Take 1 tablet by mouth every 6 (six) hours as needed.   levocetirizine 5 MG tablet Commonly known as: XYZAL  Take 1 tablet (5 mg total) by mouth every evening.   meloxicam  7.5 MG tablet Commonly known as: Mobic  Take 1 tablet (7.5 mg total) by mouth 2 (two) times daily as needed.   metroNIDAZOLE  500 MG tablet Commonly known as: FLAGYL  Take 1 tablet (500 mg total) by mouth 2 (two) times daily.   omeprazole  40 MG capsule Commonly known as: PRILOSEC TAKE ONE CAPSULE BY MOUTH DAILY   pioglitazone  15 MG tablet Commonly known as: ACTOS  Take 1 tablet (15 mg total) by mouth daily.   rosuvastatin  20 MG tablet Commonly known as: Crestor  Take 1 tablet (20 mg total) by mouth daily.   sodium polystyrene 15 GM/60ML suspension Commonly known as: KAYEXALATE  60 ml  twice daily for 3 days         OBJECTIVE:   Vital Signs: BP 130/80   Pulse 79   Ht 5' 10 (1.778 m)   Wt 187 lb (84.8 kg)   SpO2 98%   BMI 26.83 kg/m   Wt Readings from Last 3 Encounters:  05/19/24 187 lb (84.8 kg)  05/17/24 189 lb 12.8 oz (86.1 kg)  05/01/24 184 lb 3.2 oz (83.6 kg)     Exam: General: Pt appears well and is in NAD  Lungs: Clear with good BS bilat   Heart: RRR   Abdomen: soft, nontender  Extremities: No pretibial edema.   Neuro: MS is good with appropriate affect, pt is alert and Ox3     DM foot exam: 01/17/2024   The skin of the feet is intact without sores or ulcerations. The pedal pulses are 1+ on right and 1 + on left. The sensation is intact to a screening 5.07, 10 gram monofilament bilaterally      DATA REVIEWED:  Lab Results  Component Value Date   HGBA1C 8.1 (A) 01/17/2024   HGBA1C 8.7 (A) 06/22/2023   HGBA1C 8.3 (A) 10/23/2022     Latest Reference Range & Units 05/01/24 10:48 05/17/24 08:58  Sodium 135 - 146 mmol/L 138   Potassium 3.5 - 5.3 mmol/L 4.7   Chloride 98 - 110 mmol/L 99   CO2 20 - 32 mmol/L 31   Glucose 65 - 99 mg/dL 829 (H)   BUN 7 - 25 mg/dL 12   Creatinine 9.29 - 1.30 mg/dL 9.39 (L)   Calcium  8.6 - 10.3 mg/dL 89.3 (H) 89.6  BUN/Creatinine Ratio 6 - 22 (calc) 20   AG Ratio 1.0 - 2.5 (calc) 2.1   AST 10 - 35 U/L 16   ALT 9 - 46 U/L 27   Total Protein 6.1 - 8.1 g/dL 7.4   Total Bilirubin 0.2 - 1.2 mg/dL 0.7    ASSESSMENT / PLAN / RECOMMENDATIONS:   1) Type 2 Diabetes Mellitus, Poorly Controlled, Without complications - Most recent A1c of 8.3 %. Goal A1c < 7.0 %.   -A1c has trended up from 8.1% to 8.3%, but the patient has been going for multiple dental infections, requiring prednisone and multiple antibiotics - Intolerant to Metformin and GLP-1 agonists  - I have decreased Farxiga  due to hyperkalemia - Patient continues with hyperglycemia, I will increase glipizide  from 5 mg dose to 10 mg dose.  Patient was  advised to take 15 mg twice daily of the current glipizide  5 mg dose, once he is done with this bottle he will start taking glipizide  10 mg, 2 tablets before breakfast and before supper, should his BG start to trend below 150 MGs/DL, the patient will decrease glipizide  to 10 mg, to 1 tablet before breakfast and 1 tablet before supper   MEDICATIONS: - Change glipizide  10 mg,2 tablet before Breakfast and 2 tablet before supper  - Continue Farxiga  5 mg daily - Continue pioglitazone  15 mg daily    EDUCATION / INSTRUCTIONS: BG monitoring instructions: Patient is instructed to check his blood sugars 1 times a day Call [facility] if: BG persistently < 70  I reviewed the Rule of 15 for the treatment of hypoglycemia in detail with the patient. Literature supplied.    2) Diabetic complications:  Eye: Does not have known diabetic retinopathy.  Neuro/ Feet: Does not have known diabetic peripheral neuropathy .  Renal: Patient does not have known baseline CKD. He   Is not  on an ACEI/ARB at present.    3) Dyslipidemia:  -LDL and triglycerides elevated were elevated and increased rosuvastatin  from 10 mg to 20 mg in June, 2025  Medication  rosuvastatin  20 mg daily  4) Peripheral neuropathy :   - He did not tolerate gabapentin     F/U in 4 months    Signed electronically by: Stefano Redgie Butts, MD  [facility]  [facility] [address] [place] [place], [place] [address] Phone: [phone] FAX: [phone]   CC: Dorina Loving, PA-C [address] [place]  [place] [address] Phone: [phone]  Fax: [phone]  Return to Endocrinology clinic as below: Future Appointments  Date Time Provider Department Center  05/19/2024  5:00 PM [facility]

## 2024-05-19 NOTE — Telephone Encounter (Signed)
 Referral coordinator spoke with imaging [facility] Harlene Persons) , they are trying to fix orders to STAT

## 2024-05-19 NOTE — Patient Instructions (Addendum)
-   Continue  farxiga  5 mg , 1 tablet daily  - Change Glipizide  10 mg, 2 tablets before Breakfast and 2 tablets before supper  - Continue pioglitazone  15 mg daily     HOW TO TREAT LOW BLOOD SUGARS (Blood sugar LESS THAN 70 MG/DL) Please follow the RULE OF 15 for the treatment of hypoglycemia treatment (when your (blood sugars are less than 70 mg/dL)   STEP 1: Take 15 grams of carbohydrates when your blood sugar is low, which includes:  3-4 GLUCOSE TABS  OR 3-4 OZ OF JUICE OR REGULAR SODA OR ONE TUBE OF GLUCOSE GEL    STEP 2: RECHECK blood sugar in 15 MINUTES STEP 3: If your blood sugar is still low at the 15 minute recheck --> then, go back to STEP 1 and treat AGAIN with another 15 grams of carbohydrates.

## 2024-05-20 ENCOUNTER — Other Ambulatory Visit: Payer: Self-pay | Admitting: Medical

## 2024-06-02 ENCOUNTER — Other Ambulatory Visit

## 2024-06-09 ENCOUNTER — Other Ambulatory Visit

## 2024-06-14 ENCOUNTER — Other Ambulatory Visit

## 2024-06-19 ENCOUNTER — Other Ambulatory Visit (INDEPENDENT_AMBULATORY_CARE_PROVIDER_SITE_OTHER)

## 2024-06-19 DIAGNOSIS — R7989 Other specified abnormal findings of blood chemistry: Secondary | ICD-10-CM | POA: Diagnosis not present

## 2024-06-19 LAB — IRON,TIBC AND FERRITIN PANEL
%SAT: 30 % (ref 20–48)
Ferritin: 299 ng/mL (ref 38–380)
Iron: 102 ug/dL (ref 50–180)
TIBC: 337 ug/dL (ref 250–425)

## 2024-07-17 ENCOUNTER — Other Ambulatory Visit: Payer: Self-pay | Admitting: Medical

## 2024-08-05 ENCOUNTER — Other Ambulatory Visit: Payer: Self-pay | Admitting: Internal Medicine

## 2024-08-25 ENCOUNTER — Encounter: Payer: Self-pay | Admitting: Medical

## 2024-08-25 ENCOUNTER — Other Ambulatory Visit: Payer: Self-pay

## 2024-08-25 ENCOUNTER — Other Ambulatory Visit: Payer: Self-pay | Admitting: Medical

## 2024-08-25 MED ORDER — OMEPRAZOLE 40 MG PO CPDR: 40.0000 mg | DELAYED_RELEASE_CAPSULE | Freq: Every day | ORAL | 0 refills | Status: AC

## 2024-09-27 ENCOUNTER — Ambulatory Visit: Admitting: Internal Medicine
